# Patient Record
Sex: Female | Born: 1960 | Race: White | Hispanic: No | Smoking: Never smoker
Health system: Southern US, Community
[De-identification: ages and names within clinical notes are randomized; demographics above are authoritative.]

## PROBLEM LIST (undated history)

## (undated) DIAGNOSIS — K635 Polyp of colon: Secondary | ICD-10-CM

## (undated) DIAGNOSIS — J329 Chronic sinusitis, unspecified: Secondary | ICD-10-CM

## (undated) DIAGNOSIS — R519 Headache, unspecified: Secondary | ICD-10-CM

## (undated) DIAGNOSIS — E039 Hypothyroidism, unspecified: Secondary | ICD-10-CM

## (undated) DIAGNOSIS — G8929 Other chronic pain: Secondary | ICD-10-CM

## (undated) DIAGNOSIS — K219 Gastro-esophageal reflux disease without esophagitis: Secondary | ICD-10-CM

## (undated) DIAGNOSIS — G4730 Sleep apnea, unspecified: Secondary | ICD-10-CM

## (undated) DIAGNOSIS — R51 Headache: Secondary | ICD-10-CM

## (undated) HISTORY — DX: Chronic sinusitis, unspecified: J32.9

## (undated) HISTORY — DX: Headache: R51

## (undated) HISTORY — DX: Polyp of colon: K63.5

## (undated) HISTORY — DX: Headache, unspecified: R51.9

## (undated) HISTORY — PX: ABDOMINAL HYSTERECTOMY: SHX81

## (undated) HISTORY — DX: Gastro-esophageal reflux disease without esophagitis: K21.9

## (undated) HISTORY — PX: MENISCUS REPAIR: SHX5179

## (undated) HISTORY — DX: Sleep apnea, unspecified: G47.30

## (undated) HISTORY — DX: Hypothyroidism, unspecified: E03.9

## (undated) HISTORY — DX: Other chronic pain: G89.29

---

## 1999-05-10 ENCOUNTER — Other Ambulatory Visit: Admission: RE | Admit: 1999-05-10 | Discharge: 1999-05-10 | Payer: Self-pay | Admitting: Obstetrics and Gynecology

## 2000-07-04 ENCOUNTER — Other Ambulatory Visit: Admission: RE | Admit: 2000-07-04 | Discharge: 2000-07-04 | Payer: Self-pay | Admitting: Obstetrics and Gynecology

## 2001-09-10 ENCOUNTER — Other Ambulatory Visit: Admission: RE | Admit: 2001-09-10 | Discharge: 2001-09-10 | Payer: Self-pay | Admitting: Obstetrics and Gynecology

## 2003-07-06 ENCOUNTER — Other Ambulatory Visit: Admission: RE | Admit: 2003-07-06 | Discharge: 2003-07-06 | Payer: Self-pay | Admitting: Obstetrics and Gynecology

## 2003-11-24 LAB — HM PAP SMEAR: HM Pap smear: NORMAL

## 2004-02-18 ENCOUNTER — Ambulatory Visit: Payer: Self-pay

## 2005-01-19 ENCOUNTER — Emergency Department: Payer: Self-pay | Admitting: Emergency Medicine

## 2005-03-07 ENCOUNTER — Ambulatory Visit: Payer: Self-pay | Admitting: General Practice

## 2005-04-10 ENCOUNTER — Other Ambulatory Visit: Admission: RE | Admit: 2005-04-10 | Discharge: 2005-04-10 | Payer: Self-pay | Admitting: Obstetrics and Gynecology

## 2005-08-05 ENCOUNTER — Emergency Department: Payer: Self-pay | Admitting: Emergency Medicine

## 2005-12-12 ENCOUNTER — Ambulatory Visit: Payer: Self-pay

## 2006-03-15 ENCOUNTER — Ambulatory Visit: Payer: Self-pay | Admitting: General Practice

## 2007-03-12 ENCOUNTER — Ambulatory Visit: Payer: Self-pay | Admitting: General Practice

## 2008-04-01 ENCOUNTER — Ambulatory Visit: Payer: Self-pay | Admitting: General Practice

## 2009-01-24 ENCOUNTER — Emergency Department: Payer: Self-pay | Admitting: Unknown Physician Specialty

## 2009-04-05 ENCOUNTER — Ambulatory Visit: Payer: Self-pay | Admitting: General Practice

## 2009-08-22 ENCOUNTER — Ambulatory Visit: Payer: Self-pay | Admitting: Family Medicine

## 2009-08-22 DIAGNOSIS — J309 Allergic rhinitis, unspecified: Secondary | ICD-10-CM | POA: Insufficient documentation

## 2009-08-22 DIAGNOSIS — J019 Acute sinusitis, unspecified: Secondary | ICD-10-CM

## 2009-08-22 DIAGNOSIS — K219 Gastro-esophageal reflux disease without esophagitis: Secondary | ICD-10-CM

## 2010-03-15 NOTE — Assessment & Plan Note (Signed)
Summary: sinus infection/jbb   Vital Signs:  Patient Profile:   50 Years Old Female CC:      head congestion, sinus pressure Height:     63.75 inches Weight:      231 pounds BMI:     40.11 O2 Sat:      96 % O2 treatment:    Room Air Temp:     97.2 degrees F oral Pulse rate:   68 / minute Resp:     16 per minute BP sitting:   102 / 80  (left arm)  Pt. in pain?   no  Vitals Entered By: Adella Hare LPN (August 22, 2009 11:25 AM)                   Current Allergies (reviewed today): ! DEMEROLHistory of Present Illness History from: patient Reason for visit: see chief complaint Chief Complaint: head congestion, sinus pressure History of Present Illness: complains of head congestion, sinus pressure, headache, low grade fever at times, body aches, post nasal drainage x approx 3 days been using otc sudafed and ibuprofen.  She will get about 3 sinus infections a year-deals with hayfever-has Flonase but did not think about using this. Amoxil worked well in Jan, before that Z-pak did not work well.  She feels like she is really getting worse, and with kids at work, and upcoming vacation, she felt she should not wait any longer.  REVIEW OF SYSTEMS Constitutional Symptoms       Complains of fever and chills.     Denies night sweats, weight loss, weight gain, and fatigue.  Eyes       Denies change in vision, eye pain, eye discharge, glasses, contact lenses, and eye surgery. Ear/Nose/Throat/Mouth       Complains of change in hearing and sore throat.      Denies hearing loss/aids, ear pain, ear discharge, dizziness, frequent runny nose, frequent nose bleeds, sinus problems, hoarseness, and tooth pain or bleeding.  Respiratory       Denies dry cough, productive cough, wheezing, shortness of breath, asthma, bronchitis, and emphysema/COPD.  Cardiovascular       Denies murmurs, chest pain, and tires easily with exhertion.    Gastrointestinal       Denies stomach pain, nausea/vomiting,  diarrhea, constipation, blood in bowel movements, and indigestion. Genitourniary       Denies painful urination, kidney stones, and loss of urinary control. Neurological       Denies paralysis, seizures, and fainting/blackouts. Musculoskeletal       Denies muscle pain, joint pain, joint stiffness, decreased range of motion, redness, swelling, muscle weakness, and gout.  Skin       Denies bruising, unusual mles/lumps or sores, and hair/skin or nail changes.  Psych       Denies mood changes, temper/anger issues, anxiety/stress, speech problems, depression, and sleep problems.  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 Demerol  Physical Exam General appearance: well developed, well nourished, no acute distress, looks uncomfortable Head: normocephalic, atraumatic Eyes: conjunctivae and lids normal Pupils: equal,  round, reactive to light Ears: normal, no lesions or deformities. Left TM appears normal-no fluid seen Nasal: mucosa pink, nonedematous, no septal deviation, turbinates irritated, little exudate on left Oral/Pharynx: tongue normal, posterior pharynx without erythema or exudate. Right tonsil larger than left-still normal Neck: neck supple,  trachea midline, no masses Chest/Lungs: no rales, wheezes, or rhonchi bilateral, breath sounds equal without effort Heart: regular rate and  rhythm, no murmur Extremities: normal extremities Neurological: grossly intact and non-focal Skin: no  obvious rashes or lesions MSE: oriented to time, place, and person Assessment New Problems: SINUSITIS, ACUTE (ICD-461.9) ALLERGIC RHINITIS (ICD-477.9) GERD (ICD-530.81)   Plan New Medications/Changes: AMOXICILLIN 500 MG CAPS (AMOXICILLIN) one cap three times a day  #30 x 0, 08/22/2009, Adella Hare LPN  New Orders: New Patient Level I [99201] Planning Comments:   Resume Flonase, may use Afrin 1-2 more days, and may continue other OTC as have been doing.   The patient and/or caregiver has been counseled thoroughly with regard to medications prescribed including dosage, schedule, interactions, rationale for use, and possible side effects and they verbalize understanding.  Diagnoses and expected course of recovery discussed and will return if not improved as expected or if the condition worsens. Patient and/or caregiver verbalized understanding.  Prescriptions: AMOXICILLIN 500 MG CAPS (AMOXICILLIN) one cap three times a day  #30 x 0   Entered by:   Adella Hare LPN   Authorized by:   Kathrynn Running MD   Signed by:   Adella Hare LPN on 16/11/9602   Method used:   Electronically to        [facility]  [address] (retail)       [address], [address]       [place], [place]  [address]       Ph: [phone]       Fax: [phone]   RxID:   [id number]   Orders Added: 1)  New Patient Level I [99201]  The risks, benefits and possible side effects were clearly explained and discussed with the patient.  The patient verbalized clear understanding.  The patient was given instructions to return if symptoms don't improve, worsen or new changes develop.  If it is not during clinic hours and the patient cannot get back to this clinic then the patient was told to seek medical care at an available urgent care or emergency department.  The patient verbalized understanding.    The patient was informed that there is no on-call provider or services available at this  clinic during off-hours (when the clinic is closed).  If the patient developed a problem or concern that required immediate attention, the patient was advised to go the the nearest available urgent care or emergency department for medical care.  The patient verbalized understanding.     It was clearly explained to the patient that this [facility] is not intended to be a primary care clinic.  The patient is always better served by the continuity of care and the provider/patient relationships developed with their dedicated primary care provider.  The patient was told to be sure to follow up as soon as possible with their primary care provider to discuss treatments received and to receive further examination and testing.  The patient verbalized understanding. The will f/u with PCP ASAP.  I have reviewed the above medical office visit documention, including diagnoses, history, medications, clinical lists, orders and plan of care.   Rodney Langton,  MD, FAAFP  August 22, 2009 Added new allergy or adverse reaction of DEMEROL

## 2010-04-12 ENCOUNTER — Ambulatory Visit: Payer: Self-pay | Admitting: General Practice

## 2010-07-26 ENCOUNTER — Ambulatory Visit: Payer: Self-pay | Admitting: Internal Medicine

## 2010-10-06 ENCOUNTER — Other Ambulatory Visit (INDEPENDENT_AMBULATORY_CARE_PROVIDER_SITE_OTHER): Payer: BC Managed Care – PPO | Admitting: Internal Medicine

## 2010-10-06 DIAGNOSIS — N390 Urinary tract infection, site not specified: Secondary | ICD-10-CM

## 2010-10-06 LAB — POCT URINALYSIS DIPSTICK
Ketones, UA: NEGATIVE
Protein, UA: NEGATIVE
Spec Grav, UA: 1.025
pH, UA: 6

## 2010-10-06 MED ORDER — SULFAMETHOXAZOLE-TRIMETHOPRIM 800-160 MG PO TABS: 1.0000 | ORAL_TABLET | Freq: Two times a day (BID) | ORAL | Status: AC

## 2010-10-09 LAB — URINE CULTURE: Colony Count: 7000

## 2010-10-10 ENCOUNTER — Encounter: Payer: Self-pay | Admitting: Internal Medicine

## 2010-10-24 ENCOUNTER — Other Ambulatory Visit (INDEPENDENT_AMBULATORY_CARE_PROVIDER_SITE_OTHER): Payer: BC Managed Care – PPO | Admitting: *Deleted

## 2010-10-24 DIAGNOSIS — E039 Hypothyroidism, unspecified: Secondary | ICD-10-CM

## 2010-11-07 ENCOUNTER — Telehealth: Payer: Self-pay | Admitting: Internal Medicine

## 2010-11-07 NOTE — Telephone Encounter (Signed)
Patient called and stated she didn't finish her antibiotic she had two days to go and she started feeling better and now she is having the UTI symptoms again and was wondering if you could call in another Rx or should she come and give a specimen.

## 2010-11-07 NOTE — Telephone Encounter (Signed)
She should give a specimen and we should call in Bactrim DS tab po bid x 7 days.  She needs to give the specimen because may now have resistant infection.

## 2010-11-09 ENCOUNTER — Other Ambulatory Visit: Payer: Self-pay | Admitting: Internal Medicine

## 2010-11-09 MED ORDER — SULFAMETHOXAZOLE-TRIMETHOPRIM 800-160 MG PO TABS: 1.0000 | ORAL_TABLET | Freq: Two times a day (BID) | ORAL | Status: AC

## 2010-11-11 NOTE — Telephone Encounter (Signed)
Patient notified per Pam.

## 2010-11-13 ENCOUNTER — Emergency Department: Payer: Self-pay | Admitting: Emergency Medicine

## 2010-12-05 ENCOUNTER — Encounter: Payer: Self-pay | Admitting: Internal Medicine

## 2011-01-06 ENCOUNTER — Ambulatory Visit: Payer: BC Managed Care – PPO

## 2011-01-20 ENCOUNTER — Ambulatory Visit (INDEPENDENT_AMBULATORY_CARE_PROVIDER_SITE_OTHER): Payer: BC Managed Care – PPO | Admitting: Internal Medicine

## 2011-01-20 DIAGNOSIS — Z23 Encounter for immunization: Secondary | ICD-10-CM

## 2011-02-01 ENCOUNTER — Telehealth: Payer: Self-pay | Admitting: *Deleted

## 2011-02-01 MED ORDER — OSELTAMIVIR PHOSPHATE 75 MG PO CAPS: 75.0000 mg | ORAL_CAPSULE | Freq: Two times a day (BID) | ORAL | Status: AC

## 2011-02-01 NOTE — Telephone Encounter (Signed)
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 Yesterday pt developed fever, chills, body aches, sinus congestion & cough. Ok to [employer] tamiflu per MD, Patient informed

## 2011-02-03 ENCOUNTER — Other Ambulatory Visit: Payer: Self-pay | Admitting: *Deleted

## 2011-02-03 MED ORDER — LEVOTHYROXINE SODIUM 50 MCG PO TABS: 50.0000 ug | ORAL_TABLET | Freq: Every day | ORAL | Status: DC

## 2011-03-17 LAB — HM MAMMOGRAPHY: HM Mammogram: NORMAL

## 2011-03-20 ENCOUNTER — Telehealth: Payer: Self-pay | Admitting: *Deleted

## 2011-03-20 MED ORDER — LEVOTHYROXINE SODIUM 100 MCG PO TABS: 100.0000 ug | ORAL_TABLET | Freq: Every day | ORAL | Status: DC

## 2011-03-20 NOTE — Telephone Encounter (Signed)
Patient informed, Scheduled for labs

## 2011-03-20 NOTE — Telephone Encounter (Signed)
Pt states that she has been taking 100 mcg of synthroid qd for 6 + mths. She would like RF and wants to know if she needs labs?

## 2011-03-20 NOTE — Telephone Encounter (Signed)
Fine for refill. We should get CMP, TSH with labs.

## 2011-03-23 ENCOUNTER — Other Ambulatory Visit (INDEPENDENT_AMBULATORY_CARE_PROVIDER_SITE_OTHER): Payer: BC Managed Care – PPO | Admitting: *Deleted

## 2011-03-23 DIAGNOSIS — Z79899 Other long term (current) drug therapy: Secondary | ICD-10-CM

## 2011-03-23 DIAGNOSIS — E039 Hypothyroidism, unspecified: Secondary | ICD-10-CM

## 2011-03-24 ENCOUNTER — Other Ambulatory Visit: Payer: BC Managed Care – PPO

## 2011-03-29 ENCOUNTER — Other Ambulatory Visit: Payer: BC Managed Care – PPO

## 2011-04-03 ENCOUNTER — Ambulatory Visit: Payer: BC Managed Care – PPO | Admitting: Internal Medicine

## 2011-04-07 ENCOUNTER — Telehealth: Payer: Self-pay | Admitting: Internal Medicine

## 2011-04-07 NOTE — Telephone Encounter (Signed)
[facility] Triage Call Report Triage Record Num: [id number] Operator: Revonda Humphrey Patient Name: Kara Spears Call Date & Time: 04/07/2011 12:04:21PM Patient Phone: [phone] PCP: Ronna Polio Patient Gender: Female PCP Fax : [phone] Patient DOB: 03-Apr-1960 Practice Name: [facility] Reason for Call: Caller: Twila/Patient; PCP: Ronna Polio; CB#: [phone]; ; ; Call regarding Ques About Scabies; Parents of school children have scabies and caller has not had any contact with them or their children. Own children do not have rash or itching. Gave information about scabies from [facility]. Noting 2 small bumps between fingers so started using cream to treat fungus, bumps have receeded. Worried about scabies since others have it, but again no exposure. Does think it may have looked like poison ivy at the beginning and does have dog that is outside carrying pieces of wood to her that she has handled etc. Will observe and if Sx continue will make appt for check. Protocol(s) Used: [employer], [place], or Scientist, research (medical)) Used: Rash Recommended Outcome per Protocol: Provide Home/Self Care Reason for Outcome: Known or suspected exposure to [employer], [place] OR [place] All other situations Care Advice: ~ Resting will help avoid overheating and sweating which will intensify the irritation. Apply cool compresses to affected area(s) for 20 minutes 4 to 6 times daily to help relieve itching and provide a topical anesthesia. ~ Calamine is appropriate if used as directed on the label or by a pharmacist. DO NOT use topical preparations with benzocaine because they can be sensitizing agents and can cause an allergic reaction. ~ If you were with your pet, give him a bath with pet shampoo while wearing rubber gloves. Your pet will not likely to be sensitive to the oil but the oil will stick to their fur and cause a reaction in someone who pets them. ~ Itching  Relief: - Avoid scratching or rubbing irritated area; may cause further irritation and secondary infection - Take cool showers or baths several times a day to relieve itching; do not use soap - If cool water alone does not relieve itching, try adding 1/2 to 1 cup baking soda to bath water - Follow with application of a bland lotion such as calamine (do not apply to the eyes or genitals). ~ 04/07/2011 12:28:16PM Page 1 of 1 [id number]

## 2011-04-19 ENCOUNTER — Ambulatory Visit: Payer: Self-pay | Admitting: General Practice

## 2011-05-10 ENCOUNTER — Other Ambulatory Visit (INDEPENDENT_AMBULATORY_CARE_PROVIDER_SITE_OTHER): Payer: BC Managed Care – PPO | Admitting: *Deleted

## 2011-05-10 ENCOUNTER — Telehealth: Payer: Self-pay | Admitting: *Deleted

## 2011-05-10 DIAGNOSIS — Z0000 Encounter for general adult medical examination without abnormal findings: Secondary | ICD-10-CM

## 2011-05-10 DIAGNOSIS — R5381 Other malaise: Secondary | ICD-10-CM

## 2011-05-10 DIAGNOSIS — E039 Hypothyroidism, unspecified: Secondary | ICD-10-CM

## 2011-05-10 DIAGNOSIS — R5383 Other fatigue: Secondary | ICD-10-CM

## 2011-05-10 LAB — CBC WITH DIFFERENTIAL/PLATELET
Basophils Absolute: 0 10*3/uL (ref 0.0–0.1)
Eosinophils Relative: 3.4 % (ref 0.0–5.0)
Lymphocytes Relative: 27.4 % (ref 12.0–46.0)
Monocytes Relative: 7.9 % (ref 3.0–12.0)
Neutrophils Relative %: 60.8 % (ref 43.0–77.0)
Platelets: 292 10*3/uL (ref 150.0–400.0)
RDW: 15.3 % — ABNORMAL HIGH (ref 11.5–14.6)
WBC: 7.6 10*3/uL (ref 4.5–10.5)

## 2011-05-10 LAB — COMPREHENSIVE METABOLIC PANEL
ALT: 36 U/L — ABNORMAL HIGH (ref 0–35)
Albumin: 3.8 g/dL (ref 3.5–5.2)
CO2: 25 mEq/L (ref 19–32)
Chloride: 105 mEq/L (ref 96–112)
GFR: 101.1 mL/min (ref >60.00)
Potassium: 3.6 mEq/L (ref 3.5–5.1)
Sodium: 140 mEq/L (ref 135–145)
Total Bilirubin: 0.3 mg/dL (ref 0.3–1.2)
Total Protein: 7.2 g/dL (ref 6.0–8.3)

## 2011-05-10 LAB — LIPID PANEL: HDL: 33.9 mg/dL — ABNORMAL LOW (ref >39.00)

## 2011-05-10 NOTE — Telephone Encounter (Signed)
Patient is coming in today for labs. She is fasting. What labs would you like for me to order?

## 2011-05-10 NOTE — Telephone Encounter (Signed)
Lets order CBC, CMP, TSH, lipids, Vit D.

## 2011-05-10 NOTE — Telephone Encounter (Signed)
Thanks     Labs ordered

## 2011-05-11 ENCOUNTER — Other Ambulatory Visit: Payer: Self-pay | Admitting: *Deleted

## 2011-05-11 LAB — VITAMIN D 25 HYDROXY (VIT D DEFICIENCY, FRACTURES): Vit D, 25-Hydroxy: 10 ng/mL — ABNORMAL LOW (ref 30–89)

## 2011-05-11 MED ORDER — ERGOCALCIFEROL 1.25 MG (50000 UT) PO CAPS: 50000.0000 [IU] | ORAL_CAPSULE | ORAL | Status: DC

## 2011-05-15 ENCOUNTER — Encounter: Payer: Self-pay | Admitting: Internal Medicine

## 2011-05-15 ENCOUNTER — Ambulatory Visit (INDEPENDENT_AMBULATORY_CARE_PROVIDER_SITE_OTHER): Payer: BC Managed Care – PPO | Admitting: Internal Medicine

## 2011-05-15 VITALS — BP 122/80 | HR 91 | Temp 97.0°F | Ht 65.0 in | Wt 252.0 lb

## 2011-05-15 DIAGNOSIS — E039 Hypothyroidism, unspecified: Secondary | ICD-10-CM

## 2011-05-15 DIAGNOSIS — E669 Obesity, unspecified: Secondary | ICD-10-CM

## 2011-05-15 DIAGNOSIS — K219 Gastro-esophageal reflux disease without esophagitis: Secondary | ICD-10-CM | POA: Insufficient documentation

## 2011-05-15 DIAGNOSIS — F419 Anxiety disorder, unspecified: Secondary | ICD-10-CM | POA: Insufficient documentation

## 2011-05-15 DIAGNOSIS — F411 Generalized anxiety disorder: Secondary | ICD-10-CM

## 2011-05-15 DIAGNOSIS — G4730 Sleep apnea, unspecified: Secondary | ICD-10-CM

## 2011-05-15 DIAGNOSIS — Z8249 Family history of ischemic heart disease and other diseases of the circulatory system: Secondary | ICD-10-CM

## 2011-05-15 MED ORDER — DEXLANSOPRAZOLE 60 MG PO CPDR: 60.0000 mg | DELAYED_RELEASE_CAPSULE | Freq: Every day | ORAL | Status: DC

## 2011-05-15 MED ORDER — CITALOPRAM HYDROBROMIDE 10 MG PO TABS: 10.0000 mg | ORAL_TABLET | Freq: Every day | ORAL | Status: DC

## 2011-05-15 MED ORDER — PHENTERMINE HCL 37.5 MG PO CAPS: 37.5000 mg | ORAL_CAPSULE | ORAL | Status: DC

## 2011-05-15 NOTE — Assessment & Plan Note (Signed)
BMI 41. Discussed limiting caloric intake and keeping a food diary using myfitnesspal. Will start phentermine to help with appetite suppression. Goal calories <1500 daily. Encouraged increased physical activity.  Goal weight loss 1-2 lbs per week. Follow up 1 month.

## 2011-05-15 NOTE — Assessment & Plan Note (Signed)
TSH normal with recent labs. Will continue Synthroid. Follow up 1 month.

## 2011-05-15 NOTE — Assessment & Plan Note (Signed)
Family h/o aortic aneurysm in father age 49.  Will order screening abdominal US for evaluation.

## 2011-05-15 NOTE — Assessment & Plan Note (Signed)
Symptoms worsening. Will add Dexilant. If no improvement, will send testing for H. Pylori. Follow up 1 month.

## 2011-05-15 NOTE — Assessment & Plan Note (Signed)
Recent worsening in symptoms. Will start Celexa 10 mg daily. Patient will return to clinic in one month.

## 2011-05-15 NOTE — Assessment & Plan Note (Signed)
Patient is not currently wearing CPAP mask because of issues with date. She has not had a sleep study in over 10 years. Will reschedule sleep study.

## 2011-05-15 NOTE — Progress Notes (Signed)
Subjective:    Patient ID: Kara Spears, female    DOB: 03/02/1960, 51 y.o.   MRN: [id number]  HPI 51 year old female with hypothyroidism presents for followup. Her primary concern today is recent increase in anxiety and depressed mood. She notes significant increase in stress with caring for her daughter who just had a baby and in caring for her daughter who is about to get married. She also has significant work Counselling psychologist. She reports feeling sad and overwhelmed at times. She notes that she frequently cries. She has never taken medication for depression in the past.  She is also concerned about her weight. She has been trying to lose weight but has found it difficult to exercise of her busy schedule. She has used medication to help with appetite in the past with some success.  She also notes recent increase in upper epigastric abdominal pain/GERD. She has been taking omeprazole with some improvement but not complete resolution of her symptoms.  She also reports significant fatigue.  She notes she has not been wearing her CPAP because of issues with the mask. It has been over 10 years since last sleep study.  Outpatient Encounter Prescriptions as of 05/15/2011  Medication Sig Dispense Refill  . ergocalciferol (DRISDOL) 50000 UNITS capsule Take 1 capsule (50,000 Units total) by mouth once a week.  12 capsule  2  . levothyroxine (SYNTHROID, LEVOTHROID) 100 MCG tablet Take 1 tablet (100 mcg total) by mouth daily.  90 tablet  1  . omeprazole (PRILOSEC OTC) 20 MG tablet Take 20 mg by mouth daily.        . citalopram (CELEXA) 10 MG tablet Take 1 tablet (10 mg total) by mouth daily.  30 tablet  3  . dexlansoprazole (DEXILANT) 60 MG capsule Take 1 capsule (60 mg total) by mouth daily.  30 capsule  3  . phentermine 37.5 MG capsule Take 1 capsule (37.5 mg total) by mouth every morning.  30 capsule  1    Review of Systems  Constitutional: Positive for fatigue. Negative for fever, chills,  appetite change and unexpected weight change.  HENT: Negative for ear pain, congestion, sore throat, trouble swallowing, neck pain, voice change and sinus pressure.   Eyes: Negative for visual disturbance.  Respiratory: Negative for cough, shortness of breath, wheezing and stridor.   Cardiovascular: Negative for chest pain, palpitations and leg swelling.  Gastrointestinal: Negative for nausea, vomiting, abdominal pain, diarrhea, constipation, blood in stool, abdominal distention and anal bleeding.  Genitourinary: Negative for dysuria and flank pain.  Musculoskeletal: Negative for myalgias, arthralgias and gait problem.  Skin: Negative for color change and rash.  Neurological: Negative for dizziness and headaches.  Hematological: Negative for adenopathy. Does not bruise/bleed easily.  Psychiatric/Behavioral: Positive for dysphoric mood. Negative for suicidal ideas and sleep disturbance. The patient is nervous/anxious.    BP 122/80  Pulse 91  Temp(Src) 97 F (36.1 C) (Oral)  Ht 5\' 5"  (1.651 m)  Wt 252 lb (114.306 kg)  BMI 41.93 kg/m2  SpO2 97%     Objective:   Physical Exam  Constitutional: She is oriented to person, place, and time. She appears well-developed and well-nourished. No distress.  HENT:  Head: Normocephalic and atraumatic.  Right Ear: External ear normal.  Left Ear: External ear normal.  Nose: Nose normal.  Mouth/Throat: Oropharynx is clear and moist. No oropharyngeal exudate.  Eyes: Conjunctivae are normal. Pupils are equal, round, and reactive to light. Right eye exhibits no discharge. Left eye exhibits no discharge. No scleral  icterus.  Neck: Normal range of motion. Neck supple. No tracheal deviation present. No thyromegaly present.  Cardiovascular: Normal rate, regular rhythm, normal heart sounds and intact distal pulses.  Exam reveals no gallop and no friction rub.   No murmur heard. Pulmonary/Chest: Effort normal and breath sounds normal. No respiratory distress.  She has no wheezes. She has no rales. She exhibits no tenderness.  Abdominal: Soft. Bowel sounds are normal. She exhibits no distension. There is tenderness (mid-epigastric area).  Musculoskeletal: Normal range of motion. She exhibits no edema and no tenderness.  Lymphadenopathy:    She has no cervical adenopathy.  Neurological: She is alert and oriented to person, place, and time. No cranial nerve deficit. She exhibits normal muscle tone. Coordination normal.  Skin: Skin is warm and dry. No rash noted. She is not diaphoretic. No erythema. No pallor.  Psychiatric: She has a normal mood and affect. Her behavior is normal. Judgment and thought content normal.          Assessment & Plan:

## 2011-05-18 ENCOUNTER — Encounter: Payer: Self-pay | Admitting: Internal Medicine

## 2011-05-19 ENCOUNTER — Telehealth: Payer: Self-pay | Admitting: *Deleted

## 2011-05-19 NOTE — Telephone Encounter (Signed)
Daughter reports pt has had excellent results with Dexilant.  PA is required. PA # [phone] ID# [id number]

## 2011-05-25 NOTE — Telephone Encounter (Signed)
Printed form for PA online, filled out and waiting for sig from MD. Patient has enough samples to get through until Dr. Dan Humphreys is back in the office.

## 2011-05-30 NOTE — Telephone Encounter (Signed)
Form faxed, message sent to patient to inform

## 2011-06-06 NOTE — Telephone Encounter (Signed)
I spoke to Noel N at [place] and PA was approved on 05/31/11 and will be valid through 02/23/14 or life of policy.  Pharmacy notified.

## 2011-06-15 ENCOUNTER — Encounter: Payer: Self-pay | Admitting: Internal Medicine

## 2011-06-15 ENCOUNTER — Ambulatory Visit (INDEPENDENT_AMBULATORY_CARE_PROVIDER_SITE_OTHER): Payer: BC Managed Care – PPO | Admitting: Internal Medicine

## 2011-06-15 VITALS — BP 108/78 | HR 92 | Temp 98.5°F | Resp 16 | Wt 231.2 lb

## 2011-06-15 DIAGNOSIS — L259 Unspecified contact dermatitis, unspecified cause: Secondary | ICD-10-CM

## 2011-06-15 DIAGNOSIS — G4730 Sleep apnea, unspecified: Secondary | ICD-10-CM

## 2011-06-15 DIAGNOSIS — K219 Gastro-esophageal reflux disease without esophagitis: Secondary | ICD-10-CM

## 2011-06-15 DIAGNOSIS — F411 Generalized anxiety disorder: Secondary | ICD-10-CM

## 2011-06-15 DIAGNOSIS — F419 Anxiety disorder, unspecified: Secondary | ICD-10-CM

## 2011-06-15 DIAGNOSIS — L309 Dermatitis, unspecified: Secondary | ICD-10-CM | POA: Insufficient documentation

## 2011-06-15 DIAGNOSIS — R5383 Other fatigue: Secondary | ICD-10-CM

## 2011-06-15 DIAGNOSIS — E669 Obesity, unspecified: Secondary | ICD-10-CM

## 2011-06-15 MED ORDER — PHENTERMINE HCL 37.5 MG PO CAPS: 37.5000 mg | ORAL_CAPSULE | ORAL | Status: DC

## 2011-06-15 MED ORDER — TRIAMCINOLONE ACETONIDE 0.1 % EX CREA: TOPICAL_CREAM | Freq: Two times a day (BID) | CUTANEOUS | Status: DC

## 2011-06-15 NOTE — Assessment & Plan Note (Signed)
Symptoms improved with use of Dexilant. Will continue.

## 2011-06-15 NOTE — Assessment & Plan Note (Signed)
BMI 38. Congratulated patient on 20 pound weight loss. Encouraged her to continue efforts at calorie counting and increased physical activity. Will continue phentermine. Patient will followup in one month.

## 2011-06-15 NOTE — Assessment & Plan Note (Signed)
Rash on hands consistent with contact dermatitis. Will start topical triamcinolone twice daily to see if any improvement. Followup if symptoms are not improving or if they worsen.

## 2011-06-15 NOTE — Progress Notes (Signed)
Subjective:    Patient ID: Kara Spears, female    DOB: 06/04/1960, 51 y.o.   MRN: [id number]  HPI 51 year old female with history of anxiety, obesity presents for followup. In regards to her anxiety, we had initially tried treating her with Celexa. She was unable to tolerate this medication and had some altered mentation. She stopped the medication with resolution of her symptoms. She reports that her overall anxiety level is much improved recently and she does not feel that she needs to start on any new medication.  Regards to her obesity, she reports significant improvement in her appetite with use of phentermine. She has also been using MyFItnessPal to help with calorie counting. She has lost over 20 pounds since her last visit one month ago. She denies any side effects from medication such as palpitations, or agitation. She is planning to start an exercise program including water aerobics. Next  She is concerned today about red, itchy rash over her left thumb and hand. She reports that she was wearing a brace on this wrist for carpal tunnel syndrome and soon after developed an itchy rash in this area. She recently has been applying a topical steroid cream with some improvement.  In regards to her history of GERD, she reports significant improvement with the use of Dexilant.  Outpatient Encounter Prescriptions as of 06/15/2011  Medication Sig Dispense Refill  . dexlansoprazole (DEXILANT) 60 MG capsule Take 60 mg by mouth daily.      . diphenhydrAMINE (BENADRYL ALLERGY) 25 mg capsule Take 25 mg by mouth at bedtime.      . ergocalciferol (DRISDOL) 50000 UNITS capsule Take 1 capsule (50,000 Units total) by mouth once a week.  12 capsule  2  . levothyroxine (SYNTHROID, LEVOTHROID) 100 MCG tablet Take 1 tablet (100 mcg total) by mouth daily.  90 tablet  1  . phentermine 37.5 MG capsule Take 1 capsule (37.5 mg total) by mouth every morning.  30 capsule  2  . citalopram (CELEXA) 10 MG tablet Take  1 tablet (10 mg total) by mouth daily.  30 tablet  3  . triamcinolone cream (KENALOG) 0.1 % Apply topically 2 (two) times daily.  30 g  0  . DISCONTD: omeprazole (PRILOSEC OTC) 20 MG tablet Take 20 mg by mouth daily.          Review of Systems  Constitutional: Negative for fever, chills, appetite change, fatigue and unexpected weight change.  HENT: Negative for ear pain, congestion, sore throat, trouble swallowing, neck pain, voice change and sinus pressure.   Eyes: Negative for visual disturbance.  Respiratory: Negative for cough, shortness of breath, wheezing and stridor.   Cardiovascular: Negative for chest pain, palpitations and leg swelling.  Gastrointestinal: Negative for nausea, vomiting, abdominal pain, diarrhea, constipation, blood in stool, abdominal distention and anal bleeding.  Genitourinary: Negative for dysuria and flank pain.  Musculoskeletal: Negative for myalgias, arthralgias and gait problem.  Skin: Negative for color change and rash.  Neurological: Negative for dizziness and headaches.  Hematological: Negative for adenopathy. Does not bruise/bleed easily.  Psychiatric/Behavioral: Negative for suicidal ideas, sleep disturbance and dysphoric mood. The patient is not nervous/anxious.    BP 108/78  Pulse 92  Temp(Src) 98.5 F (36.9 C) (Oral)  Resp 16  Wt 231 lb 4 oz (104.894 kg)  SpO2 96%     Objective:   Physical Exam  Constitutional: She is oriented to person, place, and time. She appears well-developed and well-nourished. No distress.  HENT:  Head: Normocephalic  and atraumatic.  Right Ear: External ear normal.  Left Ear: External ear normal.  Nose: Nose normal.  Mouth/Throat: Oropharynx is clear and moist. No oropharyngeal exudate.  Eyes: Conjunctivae are normal. Pupils are equal, round, and reactive to light. Right eye exhibits no discharge. Left eye exhibits no discharge. No scleral icterus.  Neck: Normal range of motion. Neck supple. No tracheal deviation  present. No thyromegaly present.  Cardiovascular: Normal rate, regular rhythm, normal heart sounds and intact distal pulses.  Exam reveals no gallop and no friction rub.   No murmur heard. Pulmonary/Chest: Effort normal and breath sounds normal. No respiratory distress. She has no wheezes. She has no rales. She exhibits no tenderness.  Musculoskeletal: Normal range of motion. She exhibits no edema and no tenderness.  Lymphadenopathy:    She has no cervical adenopathy.  Neurological: She is alert and oriented to person, place, and time. No cranial nerve deficit. She exhibits normal muscle tone. Coordination normal.  Skin: Skin is warm and dry. No rash noted. She is not diaphoretic. No erythema. No pallor.  Psychiatric: She has a normal mood and affect. Her behavior is normal. Judgment and thought content normal.          Assessment & Plan:

## 2011-06-15 NOTE — Assessment & Plan Note (Signed)
Symptoms currently manageable without medication. Will continue to monitor.

## 2011-06-15 NOTE — Assessment & Plan Note (Signed)
Sleep study was ordered again today.

## 2011-06-30 ENCOUNTER — Telehealth: Payer: Self-pay | Admitting: Internal Medicine

## 2011-06-30 MED ORDER — ERGOCALCIFEROL 1.25 MG (50000 UT) PO CAPS: 50000.0000 [IU] | ORAL_CAPSULE | ORAL | Status: DC

## 2011-06-30 NOTE — Telephone Encounter (Signed)
Patient called and stated her dog ate all of the vitamin d tablets she had left and wanted to know if a refill could be called in for the remaining tablets.  Please advise.

## 2011-06-30 NOTE — Telephone Encounter (Signed)
Her dog will likely have GI upset.  She may want to talk to her vet. Yes, we can call a refill.

## 2011-06-30 NOTE — Telephone Encounter (Signed)
Patient notified, Rx called in. 

## 2011-07-14 ENCOUNTER — Other Ambulatory Visit: Payer: Self-pay | Admitting: Internal Medicine

## 2011-07-14 MED ORDER — ERGOCALCIFEROL 1.25 MG (50000 UT) PO CAPS: 50000.0000 [IU] | ORAL_CAPSULE | ORAL | Status: DC

## 2011-07-14 MED ORDER — CIPROFLOXACIN HCL 500 MG PO TABS: 500.0000 mg | ORAL_TABLET | Freq: Two times a day (BID) | ORAL | Status: DC

## 2011-07-20 ENCOUNTER — Ambulatory Visit (INDEPENDENT_AMBULATORY_CARE_PROVIDER_SITE_OTHER): Payer: BC Managed Care – PPO | Admitting: Internal Medicine

## 2011-07-20 ENCOUNTER — Encounter: Payer: Self-pay | Admitting: Internal Medicine

## 2011-07-20 VITALS — BP 114/78 | HR 86 | Temp 98.2°F | Resp 16 | Wt 217.5 lb

## 2011-07-20 DIAGNOSIS — E039 Hypothyroidism, unspecified: Secondary | ICD-10-CM

## 2011-07-20 DIAGNOSIS — N390 Urinary tract infection, site not specified: Secondary | ICD-10-CM

## 2011-07-20 DIAGNOSIS — R300 Dysuria: Secondary | ICD-10-CM

## 2011-07-20 DIAGNOSIS — E669 Obesity, unspecified: Secondary | ICD-10-CM

## 2011-07-20 DIAGNOSIS — E785 Hyperlipidemia, unspecified: Secondary | ICD-10-CM

## 2011-07-20 LAB — POCT URINALYSIS DIPSTICK
Bilirubin, UA: NEGATIVE
Glucose, UA: NEGATIVE
Ketones, UA: NEGATIVE
Nitrite, UA: NEGATIVE

## 2011-07-20 MED ORDER — PHENTERMINE HCL 37.5 MG PO CAPS: 37.5000 mg | ORAL_CAPSULE | ORAL | Status: DC

## 2011-07-20 MED ORDER — SULFAMETHOXAZOLE-TMP DS 800-160 MG PO TABS: 1.0000 | ORAL_TABLET | Freq: Two times a day (BID) | ORAL | Status: AC

## 2011-07-20 NOTE — Assessment & Plan Note (Signed)
Congratulated patient on continued weight loss. Encouraged her to continue with food diary and calorie restriction. Will continue phentermine for appetite suppression. Followup one month.

## 2011-07-20 NOTE — Progress Notes (Signed)
Subjective:    Patient ID: Kara Spears, female    DOB: [birth year], 51 y.o.   MRN: [id number]  HPI 51 year old female with history of obesity, hypothyroidism, and fatigue presents for followup. She is doing well. She has lost another 15 pounds. She continues to follow a healthy, low calorie diet using a food diary. She also continues on phentermine to help with appetite suppression. She denies any side effects from this medication. She has forgot to take the medication on a couple of occasions with no change in her appetite.  She notes that she is scheduled for sleep study next week.  Outpatient Encounter Prescriptions as of 07/20/2011  Medication Sig Dispense Refill  . dexlansoprazole (DEXILANT) 60 MG capsule Take 60 mg by mouth daily.      . diphenhydrAMINE (BENADRYL ALLERGY) 25 mg capsule Take 25 mg by mouth at bedtime.      . ergocalciferol (DRISDOL) 50000 UNITS capsule Take 1 capsule (50,000 Units total) by mouth once a week.  6 capsule  2  . levothyroxine (SYNTHROID, LEVOTHROID) 100 MCG tablet Take 1 tablet (100 mcg total) by mouth daily.  90 tablet  1  . phentermine 37.5 MG capsule Take 1 capsule (37.5 mg total) by mouth every morning.  30 capsule  2  . DISCONTD: phentermine 37.5 MG capsule Take 1 capsule (37.5 mg total) by mouth every morning.  30 capsule  2  . sulfamethoxazole-trimethoprim (BACTRIM DS) 800-160 MG per tablet Take 1 tablet by mouth 2 (two) times daily.  14 tablet  0   BP 114/78  Pulse 86  Temp(Src) 98.2 F (36.8 C) (Oral)  Resp 16  Wt 217 lb 8 oz (98.657 kg)  SpO2 98%  Review of Systems  Constitutional: Positive for fatigue. Negative for fever, chills, appetite change and unexpected weight change.  HENT: Negative for ear pain, congestion, sore throat, trouble swallowing, neck pain, voice change and sinus pressure.   Eyes: Negative for visual disturbance.  Respiratory: Negative for cough, shortness of breath, wheezing and stridor.   Cardiovascular: Negative  for chest pain, palpitations and leg swelling.  Gastrointestinal: Negative for nausea, vomiting, abdominal pain, diarrhea, constipation, blood in stool, abdominal distention and anal bleeding.  Genitourinary: Negative for dysuria and flank pain.  Musculoskeletal: Negative for myalgias, arthralgias and gait problem.  Skin: Negative for color change and rash.  Neurological: Negative for dizziness and headaches.  Hematological: Negative for adenopathy. Does not bruise/bleed easily.  Psychiatric/Behavioral: Negative for suicidal ideas, sleep disturbance and dysphoric mood. The patient is not nervous/anxious.        Objective:   Physical Exam  Constitutional: She is oriented to person, place, and time. She appears well-developed and well-nourished. No distress.  HENT:  Head: Normocephalic and atraumatic.  Right Ear: External ear normal.  Left Ear: External ear normal.  Nose: Nose normal.  Mouth/Throat: Oropharynx is clear and moist. No oropharyngeal exudate.  Eyes: Conjunctivae are normal. Pupils are equal, round, and reactive to light. Right eye exhibits no discharge. Left eye exhibits no discharge. No scleral icterus.  Neck: Normal range of motion. Neck supple. No tracheal deviation present. No thyromegaly present.  Cardiovascular: Normal rate, regular rhythm, normal heart sounds and intact distal pulses.  Exam reveals no gallop and no friction rub.   No murmur heard. Pulmonary/Chest: Effort normal and breath sounds normal. No respiratory distress. She has no wheezes. She has no rales. She exhibits no tenderness.  Musculoskeletal: Normal range of motion. She exhibits no edema and no tenderness.  Lymphadenopathy:    She has no cervical adenopathy.  Neurological: She is alert and oriented to person, place, and time. No cranial nerve deficit. She exhibits normal muscle tone. Coordination normal.  Skin: Skin is warm and dry. No rash noted. She is not diaphoretic. No erythema. No pallor.    Psychiatric: She has a normal mood and affect. Her behavior is normal. Judgment and thought content normal.          Assessment & Plan:

## 2011-07-22 LAB — URINE CULTURE: Colony Count: 10000

## 2011-07-23 MED ORDER — NITROFURANTOIN MONOHYD MACRO 100 MG PO CAPS: 100.0000 mg | ORAL_CAPSULE | Freq: Two times a day (BID) | ORAL | Status: AC

## 2011-07-23 NOTE — Progress Notes (Signed)
Addended by: Ronna Polio A on: 07/23/2011 10:06 PM   Modules accepted: Orders

## 2011-08-09 ENCOUNTER — Ambulatory Visit: Payer: Self-pay | Admitting: Internal Medicine

## 2011-08-16 ENCOUNTER — Other Ambulatory Visit (INDEPENDENT_AMBULATORY_CARE_PROVIDER_SITE_OTHER): Payer: BC Managed Care – PPO | Admitting: *Deleted

## 2011-08-16 DIAGNOSIS — E785 Hyperlipidemia, unspecified: Secondary | ICD-10-CM

## 2011-08-16 DIAGNOSIS — E039 Hypothyroidism, unspecified: Secondary | ICD-10-CM

## 2011-08-16 LAB — COMPREHENSIVE METABOLIC PANEL
ALT: 21 U/L (ref 0–35)
AST: 24 U/L (ref 0–37)
Alkaline Phosphatase: 59 U/L (ref 39–117)
Creatinine, Ser: 0.8 mg/dL (ref 0.4–1.2)
GFR: 82.07 mL/min (ref >60.00)
Sodium: 139 mEq/L (ref 135–145)
Total Bilirubin: 0.8 mg/dL (ref 0.3–1.2)
Total Protein: 7.6 g/dL (ref 6.0–8.3)

## 2011-08-18 ENCOUNTER — Other Ambulatory Visit: Payer: BC Managed Care – PPO

## 2011-08-21 ENCOUNTER — Telehealth: Payer: Self-pay | Admitting: Internal Medicine

## 2011-08-21 NOTE — Telephone Encounter (Signed)
Sleep study was normal. 

## 2011-08-22 LAB — LIPID PANEL
HDL: 33.9 mg/dL — ABNORMAL LOW (ref >39.00)
Total CHOL/HDL Ratio: 7
VLDL: 39.2 mg/dL (ref 0.0–40.0)

## 2011-08-22 LAB — LDL CHOLESTEROL, DIRECT: Direct LDL: 139.7 mg/dL

## 2011-08-23 ENCOUNTER — Ambulatory Visit: Payer: BC Managed Care – PPO | Admitting: Internal Medicine

## 2011-09-15 ENCOUNTER — Other Ambulatory Visit (INDEPENDENT_AMBULATORY_CARE_PROVIDER_SITE_OTHER): Payer: BC Managed Care – PPO | Admitting: *Deleted

## 2011-09-15 DIAGNOSIS — E039 Hypothyroidism, unspecified: Secondary | ICD-10-CM

## 2011-09-15 LAB — TSH: TSH: 0.74 u[IU]/mL (ref 0.35–5.50)

## 2011-09-17 ENCOUNTER — Other Ambulatory Visit: Payer: Self-pay | Admitting: Internal Medicine

## 2011-09-21 ENCOUNTER — Encounter: Payer: Self-pay | Admitting: Internal Medicine

## 2011-09-21 ENCOUNTER — Ambulatory Visit (INDEPENDENT_AMBULATORY_CARE_PROVIDER_SITE_OTHER): Payer: BC Managed Care – PPO | Admitting: Internal Medicine

## 2011-09-21 VITALS — BP 140/86 | HR 87 | Temp 98.4°F | Ht 65.0 in | Wt 199.0 lb

## 2011-09-21 DIAGNOSIS — E669 Obesity, unspecified: Secondary | ICD-10-CM

## 2011-09-21 DIAGNOSIS — E039 Hypothyroidism, unspecified: Secondary | ICD-10-CM

## 2011-09-21 DIAGNOSIS — G4730 Sleep apnea, unspecified: Secondary | ICD-10-CM

## 2011-09-21 MED ORDER — PHENTERMINE HCL 37.5 MG PO CAPS: 37.5000 mg | ORAL_CAPSULE | ORAL | Status: DC

## 2011-09-21 NOTE — Assessment & Plan Note (Signed)
Total weight loss of 50 pounds. Weight loss over the last 2 months 20 pounds. Congratulated patient on weight loss. Encouraged her to continue efforts at monitoring caloric intake and increase physical activity. Will continue to use phentermine as needed to help with appetite suppression. She is currently only using this a couple of days per week. She will followup in one month.

## 2011-09-21 NOTE — Progress Notes (Signed)
Subjective:    Patient ID: Kara Spears, female    DOB: 01/03/1961, 51 y.o.   MRN: [id number]  HPI 51 year old female with history of hypothyroidism, obesity, and sleep apnea presents for followup. She reports she is doing well. She has lost another 20 pounds over the last 2 months. She continues to use an on line food diary to help manage calorie intake. She is also increased her physical activity. She only occasionally uses phentermine to help with appetite suppression. She denies any side effects from this medication.  In regards to hypothyroidism, review of recent labs shows a decline in her TSH from previous, but this is still in the normal range. She reports occasional hot flashes but no palpitations or other symptoms. She reports full compliance with her Synthroid.  She also recently had a sleep study which showed no sleep apnea. She reports some relief that she will no longer need to use her CPAP.  Outpatient Encounter Prescriptions as of 09/21/2011  Medication Sig Dispense Refill  . dexlansoprazole (DEXILANT) 60 MG capsule Take 60 mg by mouth daily.      . diphenhydrAMINE (BENADRYL ALLERGY) 25 mg capsule Take 25 mg by mouth at bedtime.      . ergocalciferol (DRISDOL) 50000 UNITS capsule Take 1 capsule (50,000 Units total) by mouth once a week.  6 capsule  2  . levothyroxine (SYNTHROID, LEVOTHROID) 100 MCG tablet TAKE 1 TABLET (100 MCG TOTAL) BY MOUTH DAILY.  90 tablet  3  . phentermine 37.5 MG capsule Take 1 capsule (37.5 mg total) by mouth every morning.  30 capsule  2  . DISCONTD: phentermine 37.5 MG capsule Take 1 capsule (37.5 mg total) by mouth every morning.  30 capsule  2    Review of Systems  Constitutional: Negative for fever, chills, appetite change, fatigue and unexpected weight change.  HENT: Negative for ear pain, congestion, sore throat, trouble swallowing, neck pain, voice change and sinus pressure.   Eyes: Negative for visual disturbance.  Respiratory: Negative for  cough, shortness of breath, wheezing and stridor.   Cardiovascular: Negative for chest pain, palpitations and leg swelling.  Gastrointestinal: Negative for nausea, vomiting, abdominal pain, diarrhea, constipation, blood in stool, abdominal distention and anal bleeding.  Genitourinary: Negative for dysuria and flank pain.  Musculoskeletal: Negative for myalgias, arthralgias and gait problem.  Skin: Negative for color change and rash.  Neurological: Negative for dizziness and headaches.  Hematological: Negative for adenopathy. Does not bruise/bleed easily.  Psychiatric/Behavioral: Negative for suicidal ideas, disturbed wake/sleep cycle and dysphoric mood. The patient is not nervous/anxious.    BP 140/86  Pulse 87  Temp 98.4 F (36.9 C) (Oral)  Ht 5\' 5"  (1.651 m)  Wt 199 lb (90.266 kg)  BMI 33.12 kg/m2  SpO2 98%     Objective:   Physical Exam  Constitutional: She is oriented to person, place, and time. She appears well-developed and well-nourished. No distress.  HENT:  Head: Normocephalic and atraumatic.  Right Ear: External ear normal.  Left Ear: External ear normal.  Nose: Nose normal.  Mouth/Throat: Oropharynx is clear and moist. No oropharyngeal exudate.  Eyes: Conjunctivae are normal. Pupils are equal, round, and reactive to light. Right eye exhibits no discharge. Left eye exhibits no discharge. No scleral icterus.  Neck: Normal range of motion. Neck supple. No tracheal deviation present. No thyromegaly present.  Cardiovascular: Normal rate, regular rhythm, normal heart sounds and intact distal pulses.  Exam reveals no gallop and no friction rub.   No murmur  heard. Pulmonary/Chest: Effort normal and breath sounds normal. No respiratory distress. She has no wheezes. She has no rales. She exhibits no tenderness.  Musculoskeletal: Normal range of motion. She exhibits no edema and no tenderness.  Lymphadenopathy:    She has no cervical adenopathy.  Neurological: She is alert and  oriented to person, place, and time. No cranial nerve deficit. She exhibits normal muscle tone. Coordination normal.  Skin: Skin is warm and dry. No rash noted. She is not diaphoretic. No erythema. No pallor.  Psychiatric: She has a normal mood and affect. Her behavior is normal. Judgment and thought content normal.          Assessment & Plan:

## 2011-09-21 NOTE — Assessment & Plan Note (Addendum)
TSH in normal range but has declined from previous, likely secondary to 50 pound weight loss. Will plan to repeat TSH in 2-3 months. Will likely need to decrease Synthroid dose in the future.

## 2011-09-21 NOTE — Assessment & Plan Note (Signed)
Recent sleep study shows no evidence of sleep apnea. Will discontinue CPAP.

## 2011-09-22 ENCOUNTER — Other Ambulatory Visit: Payer: Self-pay | Admitting: *Deleted

## 2011-09-22 ENCOUNTER — Telehealth: Payer: Self-pay | Admitting: Internal Medicine

## 2011-09-22 MED ORDER — LEVOTHYROXINE SODIUM 75 MCG PO TABS: 75.0000 ug | ORAL_TABLET | Freq: Every day | ORAL | Status: DC

## 2011-09-22 NOTE — Telephone Encounter (Signed)
Caller: Lakyla/Patient; PCP: Ronna Polio; CB#: [phone]; ; ; Call regarding Jittery and palpitations.  Onset- 09/20/11 Afebrile. BP- 140/90 pulse- 80 Pt was seen in office yesterday and asked if she was having palpitations. She reports that last night  and today she feels like heart is racing. She now remembers this happened on last Saturday as well. Pt has Hypothyroidism. The doctor had mentioned she may need to adjust her thyroid medication when she was in office on yesterday. Emergent s/s of Irregular Heart beat protocol r/o. Call provider immediately. Message sent in EPIC.  PLEASE CALL PATIENT.

## 2011-09-22 NOTE — Telephone Encounter (Signed)
Patient advised as instructed via telephone, Rx called to [facility].

## 2011-09-22 NOTE — Telephone Encounter (Signed)
Lets decrease synthroid to daily and have her repeat TSH and free t4 in 4 weeks.

## 2011-10-30 ENCOUNTER — Ambulatory Visit: Payer: BC Managed Care – PPO | Admitting: Internal Medicine

## 2011-10-30 ENCOUNTER — Encounter: Payer: Self-pay | Admitting: Internal Medicine

## 2011-11-03 ENCOUNTER — Other Ambulatory Visit: Payer: Self-pay | Admitting: *Deleted

## 2011-11-03 ENCOUNTER — Ambulatory Visit: Payer: BC Managed Care – PPO | Admitting: Internal Medicine

## 2011-11-03 ENCOUNTER — Other Ambulatory Visit (INDEPENDENT_AMBULATORY_CARE_PROVIDER_SITE_OTHER): Payer: BC Managed Care – PPO

## 2011-11-03 ENCOUNTER — Encounter: Payer: Self-pay | Admitting: Internal Medicine

## 2011-11-03 DIAGNOSIS — R300 Dysuria: Secondary | ICD-10-CM

## 2011-11-03 DIAGNOSIS — N390 Urinary tract infection, site not specified: Secondary | ICD-10-CM

## 2011-11-03 DIAGNOSIS — E039 Hypothyroidism, unspecified: Secondary | ICD-10-CM

## 2011-11-03 LAB — POCT URINALYSIS DIPSTICK: Bilirubin, UA: NEGATIVE

## 2011-11-03 MED ORDER — CIPROFLOXACIN HCL 500 MG PO TABS: 500.0000 mg | ORAL_TABLET | Freq: Two times a day (BID) | ORAL | Status: DC

## 2011-11-04 ENCOUNTER — Other Ambulatory Visit: Payer: Self-pay | Admitting: Internal Medicine

## 2011-11-07 LAB — URINE CULTURE

## 2011-11-09 ENCOUNTER — Ambulatory Visit: Payer: BC Managed Care – PPO | Admitting: Internal Medicine

## 2011-11-18 ENCOUNTER — Other Ambulatory Visit: Payer: Self-pay | Admitting: Internal Medicine

## 2011-11-24 ENCOUNTER — Encounter: Payer: Self-pay | Admitting: Internal Medicine

## 2011-11-24 ENCOUNTER — Ambulatory Visit (INDEPENDENT_AMBULATORY_CARE_PROVIDER_SITE_OTHER): Payer: BC Managed Care – PPO | Admitting: Internal Medicine

## 2011-11-24 VITALS — BP 150/100 | HR 75 | Temp 98.4°F | Ht 65.0 in | Wt 188.2 lb

## 2011-11-24 DIAGNOSIS — E669 Obesity, unspecified: Secondary | ICD-10-CM

## 2011-11-24 DIAGNOSIS — E039 Hypothyroidism, unspecified: Secondary | ICD-10-CM

## 2011-11-24 DIAGNOSIS — E559 Vitamin D deficiency, unspecified: Secondary | ICD-10-CM

## 2011-11-24 DIAGNOSIS — F411 Generalized anxiety disorder: Secondary | ICD-10-CM

## 2011-11-24 DIAGNOSIS — Z23 Encounter for immunization: Secondary | ICD-10-CM

## 2011-11-24 DIAGNOSIS — F419 Anxiety disorder, unspecified: Secondary | ICD-10-CM

## 2011-11-24 LAB — COMPREHENSIVE METABOLIC PANEL
BUN: 14 mg/dL (ref 6–23)
CO2: 28 mEq/L (ref 19–32)
Creatinine, Ser: 0.7 mg/dL (ref 0.4–1.2)
GFR: 99.14 mL/min (ref >60.00)
Glucose, Bld: 95 mg/dL (ref 70–99)
Total Bilirubin: 0.8 mg/dL (ref 0.3–1.2)
Total Protein: 7.7 g/dL (ref 6.0–8.3)

## 2011-11-24 LAB — TSH: TSH: 1.49 u[IU]/mL (ref 0.35–5.50)

## 2011-11-24 LAB — T4, FREE: Free T4: 1.2 ng/dL (ref 0.60–1.60)

## 2011-11-24 NOTE — Assessment & Plan Note (Signed)
Given recent increase in anxiety and weight loss, recheck TSH and free T4 today. Levels are still within normal range. We'll continue Synthroid at current dose. Plan to followup in 3 months or sooner as needed.

## 2011-11-24 NOTE — Assessment & Plan Note (Addendum)
Congratulated patient on another 11 pound weight loss. Total weight loss to date 62 pounds. Given that blood pressure is elevated today will have her completely stop phentermine. Will continue keeping food diary using [employer]. Followup in 3 months or sooner as needed. Marland Kitchen

## 2011-11-24 NOTE — Assessment & Plan Note (Signed)
Increase in anxiety recently with preparations for her daughter's upcoming wedding. Discussed potentially using medications such as Xanax to help with symptoms. Patient would prefer to hold off for now. She will call if symptoms are worsening.

## 2011-11-24 NOTE — Progress Notes (Signed)
Subjective:    Patient ID: Kara Spears, female    DOB: 12/12/1960, 51 y.o.   MRN: [id number]  HPI 51 year old female with history of obesity, hypothyroidism, anxiety presents for followup. She reports she has been doing fairly well. In regards to her weight, she continues to keep a record of caloric intake. She has lost another 11 pounds. Total weight loss to date a 62 pounds. She is only sporadically using phentermine to help with appetite suppression. She has not used phentermine within the last week.  After recent visit, she complained of palpitations. She reports that these symptoms have resolved. She does note increased anxiety recently which she attributes to stress surrounding her daughter's upcoming wedding. She does not currently wish to try a medication to help with this.  In regards to hypothyroidism, she reports full compliance with medication. She questions whether dose of thyroid medication may be too high given recent increase in anxiety and palpitations.  Outpatient Encounter Prescriptions as of 11/24/2011  Medication Sig Dispense Refill  . DEXILANT 60 MG capsule TAKE 1 CAPSULE (60 MG TOTAL) BY MOUTH DAILY.  30 capsule  3  . diphenhydrAMINE (BENADRYL ALLERGY) 25 mg capsule Take 25 mg by mouth at bedtime.      Marland Kitchen levothyroxine (SYNTHROID, LEVOTHROID) 75 MCG tablet TAKE 1 TABLET BY MOUTH EVERY DAY  30 tablet  3  . phentermine 37.5 MG capsule Take 1 capsule (37.5 mg total) by mouth every morning.  30 capsule  2   BP 150/100  Pulse 75  Temp 98.4 F (36.9 C) (Oral)  Ht 5\' 5"  (1.651 m)  Wt 188 lb 4 oz (85.39 kg)  BMI 31.33 kg/m2  SpO2 95%  Review of Systems  Constitutional: Negative for fever, chills, appetite change, fatigue and unexpected weight change.  HENT: Negative for ear pain, congestion, sore throat, trouble swallowing, neck pain, voice change and sinus pressure.   Eyes: Negative for visual disturbance.  Respiratory: Negative for cough, shortness of breath,  wheezing and stridor.   Cardiovascular: Positive for palpitations. Negative for chest pain and leg swelling.  Gastrointestinal: Negative for nausea, vomiting, abdominal pain, diarrhea, constipation, blood in stool, abdominal distention and anal bleeding.  Genitourinary: Negative for dysuria and flank pain.  Musculoskeletal: Negative for myalgias, arthralgias and gait problem.  Skin: Negative for color change and rash.  Neurological: Negative for dizziness and headaches.  Hematological: Negative for adenopathy. Does not bruise/bleed easily.  Psychiatric/Behavioral: Positive for dysphoric mood. Negative for suicidal ideas and disturbed wake/sleep cycle. The patient is nervous/anxious.        Objective:   Physical Exam  Constitutional: She is oriented to person, place, and time. She appears well-developed and well-nourished. No distress.  HENT:  Head: Normocephalic and atraumatic.  Right Ear: External ear normal.  Left Ear: External ear normal.  Nose: Nose normal.  Mouth/Throat: Oropharynx is clear and moist. No oropharyngeal exudate.  Eyes: Conjunctivae normal are normal. Pupils are equal, round, and reactive to light. Right eye exhibits no discharge. Left eye exhibits no discharge. No scleral icterus.  Neck: Normal range of motion. Neck supple. No tracheal deviation present. No thyromegaly present.  Cardiovascular: Normal rate, regular rhythm, normal heart sounds and intact distal pulses.  Exam reveals no gallop and no friction rub.   No murmur heard. Pulmonary/Chest: Effort normal and breath sounds normal. No respiratory distress. She has no wheezes. She has no rales. She exhibits no tenderness.  Musculoskeletal: Normal range of motion. She exhibits no edema and no tenderness.  Lymphadenopathy:    She has no cervical adenopathy.  Neurological: She is alert and oriented to person, place, and time. No cranial nerve deficit. She exhibits normal muscle tone. Coordination normal.  Skin: Skin  is warm and dry. No rash noted. She is not diaphoretic. No erythema. No pallor.  Psychiatric: Her behavior is normal. Judgment and thought content normal. Her mood appears anxious.          Assessment & Plan:

## 2011-11-25 LAB — VITAMIN D 25 HYDROXY (VIT D DEFICIENCY, FRACTURES): Vit D, 25-Hydroxy: 29 ng/mL — ABNORMAL LOW (ref 30–89)

## 2011-11-27 ENCOUNTER — Encounter: Payer: Self-pay | Admitting: Internal Medicine

## 2011-11-27 MED ORDER — ALPRAZOLAM 0.25 MG PO TABS: 0.2500 mg | ORAL_TABLET | Freq: Two times a day (BID) | ORAL | Status: DC | PRN

## 2011-11-27 NOTE — Telephone Encounter (Signed)
Rx called to [facility], patient advised via mychart message.

## 2012-01-24 ENCOUNTER — Encounter: Payer: Self-pay | Admitting: Internal Medicine

## 2012-01-31 ENCOUNTER — Ambulatory Visit (INDEPENDENT_AMBULATORY_CARE_PROVIDER_SITE_OTHER): Payer: BC Managed Care – PPO | Admitting: Internal Medicine

## 2012-01-31 ENCOUNTER — Encounter: Payer: Self-pay | Admitting: Internal Medicine

## 2012-01-31 VITALS — BP 128/82 | HR 65 | Temp 98.3°F | Resp 16 | Wt 195.0 lb

## 2012-01-31 DIAGNOSIS — N6320 Unspecified lump in the left breast, unspecified quadrant: Secondary | ICD-10-CM

## 2012-01-31 DIAGNOSIS — N63 Unspecified lump in unspecified breast: Secondary | ICD-10-CM

## 2012-01-31 DIAGNOSIS — Z1331 Encounter for screening for depression: Secondary | ICD-10-CM

## 2012-01-31 NOTE — Progress Notes (Signed)
Subjective:    Patient ID: Kara Spears, female    DOB: 08-10-1960, 51 y.o.   MRN: [id number]  HPI 51 year old female with history of hypothyroidism and obesity presents for acute visit complaining of 4 week history of left breast mass. She reports that she has noted her breast to be more nodular since she has lost weight. She first noticed a nodular area in her left upper outer breast approximately 4 weeks ago. It is most easily palpated with her sitting up. It is not tender to palpation. It has not changed in size. She denies any overlying skin changes or discharge from her nipple. She has a family history of breast cancer in her aunt. However, she has never had a breast biopsy or abnormal mammogram.   Outpatient Encounter Prescriptions as of 01/31/2012  Medication Sig Dispense Refill  . ALPRAZolam (XANAX) 0.25 MG tablet Take 1 tablet (0.25 mg total) by mouth 2 (two) times daily as needed for sleep.  20 tablet  0  . DEXILANT 60 MG capsule TAKE 1 CAPSULE (60 MG TOTAL) BY MOUTH DAILY.  30 capsule  3  . diphenhydrAMINE (BENADRYL ALLERGY) 25 mg capsule Take 25 mg by mouth at bedtime.      Marland Kitchen levothyroxine (SYNTHROID, LEVOTHROID) 75 MCG tablet TAKE 1 TABLET BY MOUTH EVERY DAY  30 tablet  3  . [DISCONTINUED] dexlansoprazole (DEXILANT) 60 MG capsule Take 60 mg by mouth daily.      . [DISCONTINUED] phentermine 37.5 MG capsule Take 1 capsule (37.5 mg total) by mouth every morning.  30 capsule  2   BP 128/82  Pulse 65  Temp 98.3 F (36.8 C) (Oral)  Resp 16  Wt 195 lb (88.451 kg)  Review of Systems  Constitutional: Negative for fever, chills, appetite change, fatigue and unexpected weight change.  HENT: Negative for ear pain, congestion, sore throat, trouble swallowing, neck pain, voice change and sinus pressure.   Eyes: Negative for visual disturbance.  Respiratory: Negative for cough, shortness of breath, wheezing and stridor.   Cardiovascular: Negative for chest pain, palpitations and leg  swelling.  Gastrointestinal: Negative for nausea, vomiting, abdominal pain, diarrhea, constipation, blood in stool, abdominal distention and anal bleeding.  Genitourinary: Negative for dysuria and flank pain.  Musculoskeletal: Negative for myalgias, arthralgias and gait problem.  Skin: Negative for color change and rash.  Neurological: Negative for dizziness and headaches.  Hematological: Negative for adenopathy. Does not bruise/bleed easily.  Psychiatric/Behavioral: Negative for suicidal ideas, sleep disturbance and dysphoric mood. The patient is not nervous/anxious.        Objective:   Physical Exam  Constitutional: She is oriented to person, place, and time. She appears well-developed and well-nourished. No distress.  HENT:  Head: Normocephalic and atraumatic.  Right Ear: External ear normal.  Left Ear: External ear normal.  Nose: Nose normal.  Mouth/Throat: Oropharynx is clear and moist.  Eyes: Conjunctivae normal are normal. Pupils are equal, round, and reactive to light. Right eye exhibits no discharge. Left eye exhibits no discharge. No scleral icterus.  Neck: Normal range of motion. Neck supple. No tracheal deviation present. No thyromegaly present.  Pulmonary/Chest: Effort normal. Right breast exhibits no inverted nipple, no mass, no nipple discharge, no skin change and no tenderness. Left breast exhibits mass. Left breast exhibits no inverted nipple, no nipple discharge, no skin change and no tenderness. Breasts are symmetrical.    Musculoskeletal: Normal range of motion. She exhibits no edema and no tenderness.  Lymphadenopathy:    She has no  cervical adenopathy.  Neurological: She is alert and oriented to person, place, and time. No cranial nerve deficit. She exhibits normal muscle tone. Coordination normal.  Skin: Skin is warm and dry. No rash noted. She is not diaphoretic. No erythema. No pallor.  Psychiatric: She has a normal mood and affect. Her behavior is normal.  Judgment and thought content normal.          Assessment & Plan:

## 2012-01-31 NOTE — Assessment & Plan Note (Signed)
Palpable breast mass noted on exam today. Exam seems most consistent with normal breast tissue. However, given this is a new finding, will schedule diagnostic mammogram and ultrasound.

## 2012-02-19 ENCOUNTER — Encounter: Payer: Self-pay | Admitting: Internal Medicine

## 2012-02-28 ENCOUNTER — Ambulatory Visit: Payer: BC Managed Care – PPO | Admitting: Internal Medicine

## 2012-03-20 ENCOUNTER — Encounter: Payer: Self-pay | Admitting: Internal Medicine

## 2012-03-20 MED ORDER — DEXLANSOPRAZOLE 60 MG PO CPDR: 60.0000 mg | DELAYED_RELEASE_CAPSULE | Freq: Every day | ORAL | Status: DC

## 2012-03-20 MED ORDER — LEVOTHYROXINE SODIUM 75 MCG PO TABS: 75.0000 ug | ORAL_TABLET | Freq: Every day | ORAL | Status: DC

## 2012-03-20 MED ORDER — ALPRAZOLAM 0.25 MG PO TABS: 0.2500 mg | ORAL_TABLET | Freq: Two times a day (BID) | ORAL | Status: DC | PRN

## 2012-07-30 ENCOUNTER — Telehealth: Payer: Self-pay | Admitting: *Deleted

## 2012-07-30 NOTE — Telephone Encounter (Signed)
Pt is coming in for labs tomorrow 06.18.2014 what labs and dx?

## 2012-07-31 ENCOUNTER — Other Ambulatory Visit: Payer: Self-pay | Admitting: *Deleted

## 2012-07-31 ENCOUNTER — Other Ambulatory Visit: Payer: BC Managed Care – PPO

## 2012-07-31 DIAGNOSIS — E038 Other specified hypothyroidism: Secondary | ICD-10-CM

## 2012-07-31 DIAGNOSIS — Z0000 Encounter for general adult medical examination without abnormal findings: Secondary | ICD-10-CM

## 2012-07-31 NOTE — Telephone Encounter (Signed)
CMP, CBC, Lipids, TSH, Free t4 v70.0 and 244.9

## 2012-08-06 ENCOUNTER — Ambulatory Visit: Payer: BC Managed Care – PPO | Admitting: Internal Medicine

## 2012-08-24 ENCOUNTER — Encounter: Payer: Self-pay | Admitting: Internal Medicine

## 2012-10-04 ENCOUNTER — Other Ambulatory Visit (INDEPENDENT_AMBULATORY_CARE_PROVIDER_SITE_OTHER): Payer: BC Managed Care – PPO

## 2012-10-04 DIAGNOSIS — E038 Other specified hypothyroidism: Secondary | ICD-10-CM

## 2012-10-04 DIAGNOSIS — Z0000 Encounter for general adult medical examination without abnormal findings: Secondary | ICD-10-CM

## 2012-10-04 LAB — CBC WITH DIFFERENTIAL/PLATELET
Basophils Absolute: 0 10*3/uL (ref 0.0–0.1)
Lymphocytes Relative: 24.9 % (ref 12.0–46.0)
Monocytes Relative: 7.3 % (ref 3.0–12.0)
Platelets: 250 10*3/uL (ref 150.0–400.0)
RDW: 15.3 % — ABNORMAL HIGH (ref 11.5–14.6)

## 2012-10-04 LAB — COMPREHENSIVE METABOLIC PANEL
ALT: 22 U/L (ref 0–35)
Albumin: 3.7 g/dL (ref 3.5–5.2)
CO2: 28 mEq/L (ref 19–32)
Calcium: 9.1 mg/dL (ref 8.4–10.5)
Chloride: 107 mEq/L (ref 96–112)
GFR: 100.52 mL/min (ref >60.00)
Sodium: 139 mEq/L (ref 135–145)
Total Protein: 6.8 g/dL (ref 6.0–8.3)

## 2012-10-04 LAB — LIPID PANEL
Cholesterol: 222 mg/dL — ABNORMAL HIGH (ref 0–200)
HDL: 36 mg/dL — ABNORMAL LOW (ref >39.00)
VLDL: 48 mg/dL — ABNORMAL HIGH (ref 0.0–40.0)

## 2012-10-04 LAB — LDL CHOLESTEROL, DIRECT: Direct LDL: 143.2 mg/dL

## 2012-10-22 ENCOUNTER — Ambulatory Visit (INDEPENDENT_AMBULATORY_CARE_PROVIDER_SITE_OTHER): Payer: BC Managed Care – PPO | Admitting: Internal Medicine

## 2012-10-22 ENCOUNTER — Encounter: Payer: Self-pay | Admitting: Internal Medicine

## 2012-10-22 VITALS — BP 126/84 | HR 76 | Temp 98.1°F | Resp 14 | Wt 219.5 lb

## 2012-10-22 DIAGNOSIS — F411 Generalized anxiety disorder: Secondary | ICD-10-CM

## 2012-10-22 DIAGNOSIS — F419 Anxiety disorder, unspecified: Secondary | ICD-10-CM

## 2012-10-22 DIAGNOSIS — Z23 Encounter for immunization: Secondary | ICD-10-CM

## 2012-10-22 DIAGNOSIS — D649 Anemia, unspecified: Secondary | ICD-10-CM | POA: Insufficient documentation

## 2012-10-22 DIAGNOSIS — E039 Hypothyroidism, unspecified: Secondary | ICD-10-CM

## 2012-10-22 DIAGNOSIS — E669 Obesity, unspecified: Secondary | ICD-10-CM

## 2012-10-22 MED ORDER — PHENTERMINE HCL 37.5 MG PO CAPS: 37.5000 mg | ORAL_CAPSULE | ORAL | Status: DC

## 2012-10-22 NOTE — Assessment & Plan Note (Signed)
Recent increased stress with family issues. Offered support today. Will continue prn alprazolam.

## 2012-10-22 NOTE — Assessment & Plan Note (Signed)
Reviewed recent thyroid function which was normal. Will continue current dose of Levothyroxine.

## 2012-10-22 NOTE — Assessment & Plan Note (Signed)
Wt Readings from Last 3 Encounters:  10/22/12 219 lb 8 oz (99.565 kg)  01/31/12 195 lb (88.451 kg)  11/24/11 188 lb 4 oz (85.39 kg)   Will restart phentermine to help with appetite suppression. Encouraged use of My[employer] to help record caloric intake. Encouraged continued effort at regular physical activity.

## 2012-10-22 NOTE — Assessment & Plan Note (Signed)
Recent mild anemia noted on labs. No recent blood loss noted. Will check stool hemoccult. Will repeat CBC in 4 weeks.

## 2012-10-22 NOTE — Progress Notes (Signed)
Subjective:    Patient ID: Kara Spears, female    DOB: [birth year], 52 y.o.   MRN: [id number]  HPI 52YO female with h/o hypothyroidism, obesity and anxiety presents for follow up. Notes recent increased stress with passing of two close relatives and with other ongoing family stressors. Has not been following healthy diet. Having trouble getting motivated to record caloric intake as before. Recently started back at local gym. Would like to start back on phentermine to help with appetite.  Outpatient Prescriptions Prior to Visit  Medication Sig Dispense Refill  . dexlansoprazole (DEXILANT) 60 MG capsule Take 1 capsule (60 mg total) by mouth daily.  30 capsule  11  . diphenhydrAMINE (BENADRYL ALLERGY) 25 mg capsule Take 25 mg by mouth at bedtime.      Marland Kitchen levothyroxine (SYNTHROID, LEVOTHROID) 75 MCG tablet Take 1 tablet (75 mcg total) by mouth daily.  30 tablet  11  . ALPRAZolam (XANAX) 0.25 MG tablet Take 1 tablet (0.25 mg total) by mouth 2 (two) times daily as needed for sleep or anxiety.  60 tablet  3   No facility-administered medications prior to visit.   BP 126/84  Pulse 76  Temp(Src) 98.1 F (36.7 C) (Oral)  Resp 14  Wt 219 lb 8 oz (99.565 kg)  BMI 36.53 kg/m2  SpO2 98%  Review of Systems  Constitutional: Positive for fatigue. Negative for fever, chills, appetite change and unexpected weight change.  HENT: Negative for ear pain, congestion, sore throat, trouble swallowing, neck pain, voice change and sinus pressure.   Eyes: Negative for visual disturbance.  Respiratory: Negative for cough, shortness of breath, wheezing and stridor.   Cardiovascular: Negative for chest pain, palpitations and leg swelling.  Gastrointestinal: Negative for nausea, vomiting, abdominal pain, diarrhea, constipation, blood in stool, abdominal distention and anal bleeding.  Genitourinary: Negative for dysuria and flank pain.  Musculoskeletal: Negative for myalgias, arthralgias and gait problem.  Skin:  Negative for color change and rash.  Neurological: Negative for dizziness and headaches.  Hematological: Negative for adenopathy. Does not bruise/bleed easily.  Psychiatric/Behavioral: Negative for suicidal ideas, sleep disturbance and dysphoric mood. The patient is nervous/anxious.        Objective:   Physical Exam  Constitutional: She is oriented to person, place, and time. She appears well-developed and well-nourished. No distress.  HENT:  Head: Normocephalic and atraumatic.  Right Ear: External ear normal.  Left Ear: External ear normal.  Nose: Nose normal.  Mouth/Throat: Oropharynx is clear and moist. No oropharyngeal exudate.  Eyes: Conjunctivae are normal. Pupils are equal, round, and reactive to light. Right eye exhibits no discharge. Left eye exhibits no discharge. No scleral icterus.  Neck: Normal range of motion. Neck supple. No tracheal deviation present. No thyromegaly present.  Cardiovascular: Normal rate, regular rhythm, normal heart sounds and intact distal pulses.  Exam reveals no gallop and no friction rub.   No murmur heard. Pulmonary/Chest: Effort normal and breath sounds normal. No accessory muscle usage. Not tachypneic. No respiratory distress. She has no decreased breath sounds. She has no wheezes. She has no rhonchi. She has no rales. She exhibits no tenderness.  Musculoskeletal: Normal range of motion. She exhibits no edema and no tenderness.  Lymphadenopathy:    She has no cervical adenopathy.  Neurological: She is alert and oriented to person, place, and time. No cranial nerve deficit. She exhibits normal muscle tone. Coordination normal.  Skin: Skin is warm and dry. No rash noted. She is not diaphoretic. No erythema. No pallor.  Psychiatric: Her behavior is normal. Judgment and thought content normal. Her mood appears anxious.          Assessment & Plan:

## 2012-11-11 ENCOUNTER — Telehealth: Payer: Self-pay | Admitting: *Deleted

## 2012-11-11 NOTE — Telephone Encounter (Signed)
I think she only needs FOBT testing for anemia

## 2012-11-11 NOTE — Telephone Encounter (Signed)
Pt is coming in for labs tomorrow what labs and dx?  

## 2012-11-12 ENCOUNTER — Other Ambulatory Visit: Payer: BC Managed Care – PPO

## 2012-11-20 ENCOUNTER — Telehealth: Payer: Self-pay | Admitting: *Deleted

## 2012-11-20 DIAGNOSIS — D649 Anemia, unspecified: Secondary | ICD-10-CM

## 2012-11-20 NOTE — Telephone Encounter (Signed)
Pt is coming in for labs what labs and dx?

## 2012-11-20 NOTE — Telephone Encounter (Signed)
This is Dr Tilman Neat pt and the only thing I could find is f/u hgb in 4 weeks.  Orders placed for labs.  Thanks.

## 2012-11-21 ENCOUNTER — Other Ambulatory Visit (INDEPENDENT_AMBULATORY_CARE_PROVIDER_SITE_OTHER): Payer: BC Managed Care – PPO

## 2012-11-21 DIAGNOSIS — D649 Anemia, unspecified: Secondary | ICD-10-CM

## 2012-11-21 LAB — FERRITIN: Ferritin: 28.5 ng/mL (ref 10.0–291.0)

## 2012-11-21 LAB — CBC WITH DIFFERENTIAL/PLATELET
Basophils Absolute: 0 10*3/uL (ref 0.0–0.1)
Basophils Relative: 0.8 % (ref 0.0–3.0)
HCT: 37.6 % (ref 36.0–46.0)
Lymphs Abs: 1.7 10*3/uL (ref 0.7–4.0)
MCV: 78.4 fl (ref 78.0–100.0)
Monocytes Absolute: 0.5 10*3/uL (ref 0.1–1.0)
Monocytes Relative: 8.8 % (ref 3.0–12.0)
Platelets: 281 10*3/uL (ref 150.0–400.0)
RDW: 14.7 % — ABNORMAL HIGH (ref 11.5–14.6)
WBC: 5.7 10*3/uL (ref 4.5–10.5)

## 2012-11-21 LAB — IBC PANEL
Iron: 62 ug/dL (ref 42–145)
Saturation Ratios: 16 % — ABNORMAL LOW (ref 20.0–50.0)

## 2012-11-26 ENCOUNTER — Ambulatory Visit: Payer: BC Managed Care – PPO | Admitting: Internal Medicine

## 2012-12-17 ENCOUNTER — Encounter: Payer: Self-pay | Admitting: Internal Medicine

## 2012-12-17 ENCOUNTER — Ambulatory Visit (INDEPENDENT_AMBULATORY_CARE_PROVIDER_SITE_OTHER): Payer: BC Managed Care – PPO | Admitting: Internal Medicine

## 2012-12-17 VITALS — BP 110/86 | HR 87 | Temp 98.2°F | Wt 208.0 lb

## 2012-12-17 DIAGNOSIS — E785 Hyperlipidemia, unspecified: Secondary | ICD-10-CM

## 2012-12-17 DIAGNOSIS — E669 Obesity, unspecified: Secondary | ICD-10-CM

## 2012-12-17 DIAGNOSIS — E039 Hypothyroidism, unspecified: Secondary | ICD-10-CM

## 2012-12-17 DIAGNOSIS — J0100 Acute maxillary sinusitis, unspecified: Secondary | ICD-10-CM

## 2012-12-17 DIAGNOSIS — E8881 Metabolic syndrome: Secondary | ICD-10-CM

## 2012-12-17 MED ORDER — AMOXICILLIN-POT CLAVULANATE 875-125 MG PO TABS: 1.0000 | ORAL_TABLET | Freq: Two times a day (BID) | ORAL | Status: DC

## 2012-12-17 MED ORDER — PHENTERMINE HCL 37.5 MG PO CAPS: 37.5000 mg | ORAL_CAPSULE | ORAL | Status: DC

## 2012-12-17 NOTE — Assessment & Plan Note (Signed)
Symptoms and exam consistent with maxillary sinusitis. Will treat with augmentin. Rest. Prn Ibuprofen and antihistamines. Pt will call if symptoms are not improving over next few days. Follow up prn.

## 2012-12-17 NOTE — Progress Notes (Signed)
Pre-visit discussion using our clinic review tool. No additional management support is needed unless otherwise documented below in the visit note.  

## 2012-12-17 NOTE — Assessment & Plan Note (Signed)
Reviewed recent thyroid function which was normal. Continue levothyroxine. Follow up with repeat labs in 3 months, given recent weight change.

## 2012-12-17 NOTE — Progress Notes (Signed)
Subjective:    Patient ID: Kara Spears, female    DOB: 1960-04-12, 52 y.o.   MRN: [id number]  HPI 52YO female with h/o hypothyroidism, obesity presents for follow up. Feeling well. Compliant with healthy diet and trying to get regular exercise. Has lost 11lbs since her last visit. Using phentermine for appetite supression. No side effects noted. In regards to hypothyroidism, compliant with medication. No recent fatigue, temperature intolerance, constipation. Notes 1-2 week h/o purulent nasal drainage, congestion and sinus pressure. No fever, chills. No cough, dyspnea. Taking OTC cold medications with no improvement.  Outpatient Encounter Prescriptions as of 12/17/2012  Medication Sig  . ALPRAZolam (XANAX) 0.25 MG tablet Take 1 tablet (0.25 mg total) by mouth 2 (two) times daily as needed for sleep or anxiety.  Marland Kitchen dexlansoprazole (DEXILANT) 60 MG capsule Take 1 capsule (60 mg total) by mouth daily.  Marland Kitchen levothyroxine (SYNTHROID, LEVOTHROID) 75 MCG tablet Take 1 tablet (75 mcg total) by mouth daily.  . phentermine 37.5 MG capsule Take 1 capsule (37.5 mg total) by mouth every morning.   BP 110/86  Pulse 87  Temp(Src) 98.2 F (36.8 C) (Oral)  Wt 208 lb (94.348 kg)  SpO2 96%   Review of Systems  Constitutional: Positive for fatigue. Negative for fever, chills and unexpected weight change.  HENT: Positive for congestion and sinus pressure. Negative for ear discharge, ear pain, facial swelling, hearing loss, mouth sores, nosebleeds, postnasal drip, rhinorrhea, sneezing, sore throat, tinnitus, trouble swallowing and voice change.   Eyes: Negative for pain, discharge, redness and visual disturbance.  Respiratory: Negative for cough, chest tightness, shortness of breath, wheezing and stridor.   Cardiovascular: Negative for chest pain, palpitations and leg swelling.  Musculoskeletal: Negative for arthralgias, myalgias, neck pain and neck stiffness.  Skin: Negative for color change and rash.   Neurological: Negative for dizziness, weakness, light-headedness and headaches.  Hematological: Negative for adenopathy.       Objective:   Physical Exam  Constitutional: She is oriented to person, place, and time. She appears well-developed and well-nourished. No distress.  HENT:  Head: Normocephalic and atraumatic.  Right Ear: External ear normal. A middle ear effusion is present.  Left Ear: External ear normal. A middle ear effusion is present.  Nose: Mucosal edema present. Right sinus exhibits maxillary sinus tenderness and frontal sinus tenderness. Left sinus exhibits maxillary sinus tenderness and frontal sinus tenderness.  Mouth/Throat: Oropharynx is clear and moist. No oropharyngeal exudate.  Eyes: Conjunctivae are normal. Pupils are equal, round, and reactive to light. Right eye exhibits no discharge. Left eye exhibits no discharge. No scleral icterus.  Neck: Normal range of motion. Neck supple. No tracheal deviation present. No thyromegaly present.  Cardiovascular: Normal rate, regular rhythm, normal heart sounds and intact distal pulses.  Exam reveals no gallop and no friction rub.   No murmur heard. Pulmonary/Chest: Effort normal and breath sounds normal. No accessory muscle usage. Not tachypneic. No respiratory distress. She has no decreased breath sounds. She has no wheezes. She has no rhonchi. She has no rales. She exhibits no tenderness.  Musculoskeletal: Normal range of motion. She exhibits no edema and no tenderness.  Lymphadenopathy:    She has no cervical adenopathy.  Neurological: She is alert and oriented to person, place, and time. No cranial nerve deficit. She exhibits normal muscle tone. Coordination normal.  Skin: Skin is warm and dry. No rash noted. She is not diaphoretic. No erythema. No pallor.  Psychiatric: She has a normal mood and affect. Her behavior is  normal. Judgment and thought content normal.          Assessment & Plan:

## 2012-12-17 NOTE — Assessment & Plan Note (Signed)
Elevated BG>100, central obesity, and elevated triglycerides and low HDL consistent with metabolic syndrome. Will continue with efforts at healthy diet, weight loss, regular exercise. Based on ASCVD 10 year risk calculator <7.5%, no indication for statin medication at this time. Reviewed numbers today with pt.

## 2012-12-17 NOTE — Assessment & Plan Note (Signed)
Wt Readings from Last 3 Encounters:  12/17/12 208 lb (94.348 kg)  10/22/12 219 lb 8 oz (99.565 kg)  01/31/12 195 lb (88.451 kg)   Congratulated pt on weight loss 11lb. Encouraged her to continue caloric restriction and regular exercise. Will continue phentermine for appetite suppression. Follow up 3 months and prn.

## 2013-03-18 ENCOUNTER — Other Ambulatory Visit: Payer: BC Managed Care – PPO

## 2013-03-18 ENCOUNTER — Other Ambulatory Visit: Payer: Self-pay | Admitting: Internal Medicine

## 2013-03-25 ENCOUNTER — Ambulatory Visit: Payer: BC Managed Care – PPO | Admitting: Internal Medicine

## 2013-04-01 ENCOUNTER — Ambulatory Visit: Payer: BC Managed Care – PPO | Admitting: Internal Medicine

## 2013-04-15 ENCOUNTER — Other Ambulatory Visit: Payer: BC Managed Care – PPO

## 2013-04-16 ENCOUNTER — Other Ambulatory Visit: Payer: Self-pay | Admitting: Internal Medicine

## 2013-04-18 ENCOUNTER — Encounter: Payer: Self-pay | Admitting: Internal Medicine

## 2013-04-29 ENCOUNTER — Ambulatory Visit: Payer: BC Managed Care – PPO | Admitting: Internal Medicine

## 2013-06-02 ENCOUNTER — Encounter: Payer: Self-pay | Admitting: Internal Medicine

## 2013-06-03 ENCOUNTER — Other Ambulatory Visit (INDEPENDENT_AMBULATORY_CARE_PROVIDER_SITE_OTHER): Payer: BC Managed Care – PPO

## 2013-06-03 DIAGNOSIS — E785 Hyperlipidemia, unspecified: Secondary | ICD-10-CM

## 2013-06-03 DIAGNOSIS — E039 Hypothyroidism, unspecified: Secondary | ICD-10-CM

## 2013-06-03 LAB — LIPID PANEL
CHOLESTEROL: 226 mg/dL — AB (ref 0–200)
HDL: 35.4 mg/dL — AB (ref >39.00)
LDL Cholesterol: 149 mg/dL — ABNORMAL HIGH (ref 0–99)
Total CHOL/HDL Ratio: 6
Triglycerides: 208 mg/dL — ABNORMAL HIGH (ref 0.0–149.0)
VLDL: 41.6 mg/dL — ABNORMAL HIGH (ref 0.0–40.0)

## 2013-06-03 LAB — COMPREHENSIVE METABOLIC PANEL
ALT: 20 U/L (ref 0–35)
AST: 20 U/L (ref 0–37)
Albumin: 3.7 g/dL (ref 3.5–5.2)
Alkaline Phosphatase: 55 U/L (ref 39–117)
BUN: 10 mg/dL (ref 6–23)
CO2: 28 meq/L (ref 19–32)
CREATININE: 0.6 mg/dL (ref 0.4–1.2)
Calcium: 9.3 mg/dL (ref 8.4–10.5)
Chloride: 105 mEq/L (ref 96–112)
GFR: 103.88 mL/min (ref >60.00)
GLUCOSE: 97 mg/dL (ref 70–99)
Potassium: 4.1 mEq/L (ref 3.5–5.1)
Sodium: 140 mEq/L (ref 135–145)
Total Bilirubin: 0.7 mg/dL (ref 0.3–1.2)
Total Protein: 6.9 g/dL (ref 6.0–8.3)

## 2013-06-03 LAB — TSH: TSH: 3.23 u[IU]/mL (ref 0.35–5.50)

## 2013-06-05 ENCOUNTER — Ambulatory Visit (INDEPENDENT_AMBULATORY_CARE_PROVIDER_SITE_OTHER): Payer: BC Managed Care – PPO | Admitting: Internal Medicine

## 2013-06-05 ENCOUNTER — Encounter: Payer: Self-pay | Admitting: Internal Medicine

## 2013-06-05 VITALS — BP 122/76 | HR 60 | Resp 16 | Wt 218.8 lb

## 2013-06-05 DIAGNOSIS — M79641 Pain in right hand: Secondary | ICD-10-CM | POA: Insufficient documentation

## 2013-06-05 DIAGNOSIS — J309 Allergic rhinitis, unspecified: Secondary | ICD-10-CM

## 2013-06-05 DIAGNOSIS — E039 Hypothyroidism, unspecified: Secondary | ICD-10-CM

## 2013-06-05 DIAGNOSIS — K921 Melena: Secondary | ICD-10-CM

## 2013-06-05 DIAGNOSIS — M79609 Pain in unspecified limb: Secondary | ICD-10-CM

## 2013-06-05 MED ORDER — BECLOMETHASONE DIPROPIONATE 80 MCG/ACT NA AERS: 1.0000 | INHALATION_SPRAY | Freq: Every day | NASAL | Status: DC

## 2013-06-05 NOTE — Assessment & Plan Note (Signed)
Recent episodes of hematochezia, ongoing 1-2 months. Exam normal. Suspect internal hemorrhoid. Will set up GI evaluation for possible colonoscopy or flex sig. Will check CBC and ferritin with labs.

## 2013-06-05 NOTE — Assessment & Plan Note (Addendum)
Right fifth distal metacarpal pain. Unclear etiology. Exam normal. Will get plain film to evaluate for fracture. If pain persistent, consider MRI.

## 2013-06-05 NOTE — Progress Notes (Signed)
Subjective:    Patient ID: Kara Spears, female    DOB: 06/12/1960, 53 y.o.   MRN: [id number]  HPI 53YO female presents for follow up.  Hematochezia - 1-2 months. Bright red blood in stool and between stools. No rectal pain, abdominal pain, nausea. Occurs intermittently, with normal stools in between. Last colonoscopy 2 years ago. No hemorrhoids. No consistent constipation. No black tarry stools.  Cough/choking sensation - When sitting up, notes increased mucous and coughing. No trouble swallowing. Some sneezing, nasal drainage. Tried claritin with no improvement. Zyrtec too sedating. Sudafed PE is helpful.  Trigger finger - right fifth finger pain described as aching laterally. Much worse in the mornings.No trauma to hand. No weakness noted.   Review of Systems  Constitutional: Negative for fever, chills, appetite change, fatigue and unexpected weight change.  HENT: Positive for congestion, postnasal drip, rhinorrhea and sneezing. Negative for ear pain, sinus pressure, sore throat, trouble swallowing and voice change.   Eyes: Negative for visual disturbance.  Respiratory: Positive for cough. Negative for shortness of breath, wheezing and stridor.   Cardiovascular: Negative for chest pain, palpitations and leg swelling.  Gastrointestinal: Positive for blood in stool. Negative for nausea, vomiting, abdominal pain, diarrhea, constipation, abdominal distention and anal bleeding.  Genitourinary: Negative for dysuria and flank pain.  Musculoskeletal: Positive for arthralgias. Negative for gait problem, myalgias and neck pain.  Skin: Negative for color change and rash.  Neurological: Negative for dizziness and headaches.  Hematological: Negative for adenopathy. Does not bruise/bleed easily.  Psychiatric/Behavioral: Negative for suicidal ideas, sleep disturbance and dysphoric mood. The patient is not nervous/anxious.        Objective:    BP 122/76  Pulse 60  Resp 16  Wt 218 lb 12.8  oz (99.247 kg)  SpO2 97% Physical Exam  Constitutional: She is oriented to person, place, and time. She appears well-developed and well-nourished. No distress.  HENT:  Head: Normocephalic and atraumatic.  Right Ear: External ear normal.  Left Ear: External ear normal.  Nose: Nose normal.  Mouth/Throat: Oropharynx is clear and moist. No oropharyngeal exudate.  Eyes: Conjunctivae are normal. Pupils are equal, round, and reactive to light. Right eye exhibits no discharge. Left eye exhibits no discharge. No scleral icterus.  Neck: Normal range of motion. Neck supple. No tracheal deviation present. No thyromegaly present.  Cardiovascular: Normal rate, regular rhythm, normal heart sounds and intact distal pulses.  Exam reveals no gallop and no friction rub.   No murmur heard. Pulmonary/Chest: Effort normal and breath sounds normal. No accessory muscle usage. Not tachypneic. No respiratory distress. She has no decreased breath sounds. She has no wheezes. She has no rhonchi. She has no rales. She exhibits no tenderness.  Abdominal: Soft. Normal appearance and bowel sounds are normal. There is no hepatosplenomegaly. There is no tenderness.  Musculoskeletal: Normal range of motion. She exhibits no edema and no tenderness.       Right hand: Normal.       Hands: Lymphadenopathy:    She has no cervical adenopathy.  Neurological: She is alert and oriented to person, place, and time. No cranial nerve deficit. She exhibits normal muscle tone. Coordination normal.  Skin: Skin is warm and dry. No rash noted. She is not diaphoretic. No erythema. No pallor.  Psychiatric: She has a normal mood and affect. Her behavior is normal. Judgment and thought content normal.          Assessment & Plan:   Problem List Items Addressed This Visit  Allergic rhinitis     Symptoms consistent with allergic rhinitis. Unable to tolerate non-sedating antihistamines because of sedation. Will start nasal steroid, Qnasl,  daily. Will continue prn sudafed. Follow up 4 weeks and prn. If no improvement, then consider referral to allergist.    Relevant Medications      Beclomethasone Dipropionate (QNASL) 80 MCG/ACT AERS   Hematochezia - Primary     Recent episodes of hematochezia, ongoing 1-2 months. Exam normal. Suspect internal hemorrhoid. Will set up GI evaluation for possible colonoscopy or flex sig. Will check CBC and ferritin with labs.    Relevant Orders      Ambulatory referral to Gastroenterology      CBC w/Diff      Ferritin   Hypothyroidism     Recent thyroid function normal. Continue Levothyroxine.    Right hand pain     Right fifth distal metacarpal pain. Unclear etiology. Exam normal. Will get plain film to evaluate for fracture. If pain persistent, consider MRI.    Relevant Orders      DG Hand Complete Right       Return in about 4 weeks (around 07/03/2013).

## 2013-06-05 NOTE — Patient Instructions (Addendum)
Please go to our [facility] for [employer] of right hand tomorrow and lab work.  We will set up referral with Dr. Hilarie Fredrickson in GI.  Start Qnasl 1 spray each nostril daily.  Follow up here in 4 weeks.

## 2013-06-05 NOTE — Progress Notes (Signed)
Pre visit review using our clinic review tool, if applicable. No additional management support is needed unless otherwise documented below in the visit note. 

## 2013-06-05 NOTE — Assessment & Plan Note (Signed)
Symptoms consistent with allergic rhinitis. Unable to tolerate non-sedating antihistamines because of sedation. Will start nasal steroid, Qnasl, daily. Will continue prn sudafed. Follow up 4 weeks and prn. If no improvement, then consider referral to allergist.

## 2013-06-05 NOTE — Assessment & Plan Note (Signed)
Recent thyroid function normal. Continue Levothyroxine.

## 2013-06-06 ENCOUNTER — Encounter: Payer: Self-pay | Admitting: Internal Medicine

## 2013-06-06 ENCOUNTER — Other Ambulatory Visit (INDEPENDENT_AMBULATORY_CARE_PROVIDER_SITE_OTHER): Payer: BC Managed Care – PPO

## 2013-06-06 ENCOUNTER — Ambulatory Visit (INDEPENDENT_AMBULATORY_CARE_PROVIDER_SITE_OTHER)
Admission: RE | Admit: 2013-06-06 | Discharge: 2013-06-06 | Disposition: A | Discharge status: Home / Self Care | Payer: BC Managed Care – PPO | Source: Ambulatory Visit | Attending: Internal Medicine | Admitting: Internal Medicine

## 2013-06-06 ENCOUNTER — Encounter: Payer: Self-pay | Admitting: Emergency Medicine

## 2013-06-06 DIAGNOSIS — M79641 Pain in right hand: Secondary | ICD-10-CM

## 2013-06-06 DIAGNOSIS — M79609 Pain in unspecified limb: Secondary | ICD-10-CM

## 2013-06-06 DIAGNOSIS — K921 Melena: Secondary | ICD-10-CM

## 2013-06-06 LAB — CBC WITH DIFFERENTIAL/PLATELET
BASOS ABS: 0 10*3/uL (ref 0.0–0.1)
Basophils Relative: 0.5 % (ref 0.0–3.0)
Eosinophils Absolute: 0.2 10*3/uL (ref 0.0–0.7)
Eosinophils Relative: 3.2 % (ref 0.0–5.0)
HCT: 37.6 % (ref 36.0–46.0)
HEMOGLOBIN: 12.2 g/dL (ref 12.0–15.0)
LYMPHS PCT: 28.1 % (ref 12.0–46.0)
Lymphs Abs: 1.7 10*3/uL (ref 0.7–4.0)
MCHC: 32.5 g/dL (ref 30.0–36.0)
MCV: 81.4 fl (ref 78.0–100.0)
MONOS PCT: 7.2 % (ref 3.0–12.0)
Monocytes Absolute: 0.4 10*3/uL (ref 0.1–1.0)
NEUTROS ABS: 3.8 10*3/uL (ref 1.4–7.7)
Neutrophils Relative %: 61 % (ref 43.0–77.0)
PLATELETS: 281 10*3/uL (ref 150.0–400.0)
RBC: 4.62 Mil/uL (ref 3.87–5.11)
RDW: 15.2 % — ABNORMAL HIGH (ref 11.5–14.6)
WBC: 6.2 10*3/uL (ref 4.5–10.5)

## 2013-06-09 LAB — FERRITIN: FERRITIN: 26.3 ng/mL (ref 10.0–291.0)

## 2013-06-29 ENCOUNTER — Ambulatory Visit: Payer: Self-pay | Admitting: Family Medicine

## 2013-06-29 LAB — URINALYSIS, COMPLETE
Bilirubin,UR: NEGATIVE
Glucose,UR: NEGATIVE mg/dL (ref 0–75)
Ketone: NEGATIVE
Nitrite: NEGATIVE
Ph: 6 (ref 4.5–8.0)
Protein: NEGATIVE
Specific Gravity: 1.01 (ref 1.003–1.030)

## 2013-07-01 LAB — URINE CULTURE

## 2013-07-02 ENCOUNTER — Ambulatory Visit: Payer: Self-pay | Admitting: Internal Medicine

## 2013-07-03 ENCOUNTER — Telehealth: Payer: Self-pay | Admitting: Internal Medicine

## 2013-07-03 DIAGNOSIS — M25539 Pain in unspecified wrist: Secondary | ICD-10-CM

## 2013-07-03 NOTE — Telephone Encounter (Signed)
MRI of the wrist showed mild inflammation in one of the extensor tendons. I would recommend evaluation with sports medicine if her symptoms of pain are persistent.

## 2013-07-04 ENCOUNTER — Encounter: Payer: Self-pay | Admitting: Internal Medicine

## 2013-07-04 DIAGNOSIS — M79643 Pain in unspecified hand: Secondary | ICD-10-CM

## 2013-07-09 NOTE — Telephone Encounter (Signed)
Pt states that she would like a referral to sports medicine

## 2013-07-21 ENCOUNTER — Ambulatory Visit (INDEPENDENT_AMBULATORY_CARE_PROVIDER_SITE_OTHER): Payer: BC Managed Care – PPO | Admitting: Family Medicine

## 2013-07-21 ENCOUNTER — Encounter: Payer: Self-pay | Admitting: Family Medicine

## 2013-07-21 ENCOUNTER — Other Ambulatory Visit (INDEPENDENT_AMBULATORY_CARE_PROVIDER_SITE_OTHER): Payer: BC Managed Care – PPO

## 2013-07-21 VITALS — BP 126/86 | HR 84 | Ht 65.0 in | Wt 221.0 lb

## 2013-07-21 DIAGNOSIS — M79609 Pain in unspecified limb: Secondary | ICD-10-CM

## 2013-07-21 DIAGNOSIS — M79641 Pain in right hand: Secondary | ICD-10-CM

## 2013-07-21 DIAGNOSIS — G5620 Lesion of ulnar nerve, unspecified upper limb: Secondary | ICD-10-CM | POA: Insufficient documentation

## 2013-07-21 MED ORDER — MELOXICAM 15 MG PO TABS: 15.0000 mg | ORAL_TABLET | Freq: Every day | ORAL | Status: DC

## 2013-07-21 NOTE — Progress Notes (Signed)
[facility] [address], [place] [address] Phone: [phone] Subjective:    I'm seeing this patient by the request  of:  Rica Mast, MD   CC: Right hand pain  [id number] Kara Spears is a 53 y.o. female coming in with complaint of right hand pain. Patient states that she's had this pain for multiple months. Patient does not remember any true injury. Patient states the pain seems to hurt more on the ulnar aspect of the wrist. Patient states it actually can be associated with numbness as well. Patient states when she wakes up in the morning it seems to be significantly worse. Patient denies any numbness or tingling with activity to. 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 Patient did see primary care provider who did get x-rays the hand which was unremarkable and then had a MRI that was reviewed by me. Patient's MRI only showed some nonspecific extensor tendinitis. Patient rates the severity of 9/10 when it occurs at night. Patient has only tried some over-the-counter medications without any significant improvement. Patient is more concerned would like to know what it is and what can be done about it.     Past medical history, social, surgical and family history all reviewed in electronic medical record.   Review of Systems: No headache, visual changes, nausea, vomiting, diarrhea, constipation, dizziness, abdominal pain, skin rash, fevers, chills, night sweats, weight loss, swollen lymph nodes, body aches, joint swelling, muscle aches, chest pain, shortness of breath, mood changes.   Objective Blood pressure 126/86, pulse 84, height 5\' 5"  (1.651 m), weight 221 lb (100.245 kg), SpO2 97.00%.  General: No apparent distress alert and oriented x3 mood and affect normal, dressed appropriately.  HEENT: Pupils equal, extraocular movements intact  Respiratory: Patient's speak in full sentences and  does not appear short of breath  Cardiovascular: No lower extremity edema, non tender, no erythema  Skin: Warm dry intact with no signs of infection or rash on extremities or on axial skeleton.  Abdomen: Soft nontender  Neuro: Cranial nerves II through XII are intact, neurovascularly intact in all extremities with 2+ DTRs and 2+ pulses.  Lymph: No lymphadenopathy of posterior or anterior cervical chain or axillae bilaterally.  Gait normal with good balance and coordination.  MSK:  Non tender with full range of motion and good stability and symmetric strength and tone of shoulders, elbows,  hip, knee and ankles bilaterally.  Wrist: Right Inspection normal with no visible erythema or swelling. ROM smooth and normal with good flexion and extension and ulnar/radial deviation that is symmetrical with opposite wrist. Palpation is normal over metacarpals, navicular, lunate, and the patient does have some mild tenderness over the ulnar aspect near the TFCC as well as the Guyon's canal which does with compression give patient the sensation she discusses. No snuffbox tenderness. Positive tenderness over Canal of Guyon. Strength 5/5 in all directions without pain. Negative Finkelstein, tinel's and phalens. Negative Watson's test. Contralateral wrist unremarkable  MSK US performed of: Right This study was ordered, performed, and interpreted by Charlann Boxer D.O.  Wrist: All extensor compartments visualized and tendons all normal in appearance without fraying, tears, or sheath effusions. No effusion seen. TFCC intact. Does have hypoechoic changes where the ulnar nerve has significant contact. Scapholunate ligament intact. Carpal tunnel visualized and median nerve area normal, flexor tendons all normal in appearance without fraying, tears, or sheath effusions. Power doppler signal normal.  IMPRESSION: Ulnar  neuropathy.      Impression and Recommendations:     This case required medical decision  making of moderate complexity.

## 2013-07-21 NOTE — Patient Instructions (Signed)
Good to meet you Wear brace day and night for 2 weeks then nightly for 2 weeks Exercises in 96 hours and then do 3 times a week Ice 20 minutes 2 times a day Vitamin D 2000mg  daily Turmeric 500mg  twice daily.  Meloxicam daily for 10 days then as needed See me again in 3 weeks.

## 2013-07-21 NOTE — Assessment & Plan Note (Signed)
Patient does have what appears to be an ulnar nerve entrapment at is likely given her discomfort. Patient states it is mostly the worse at night. Patient is able to do activities daily living. Patient like to continue with conservative therapy. Patient was given a wrist brace, icing protocol, meloxicam daily for 10 days and was given home exercise program he should technique. Patient try these interventions and come back in 3 weeks for further evaluation. If she continues to have pain we may want to consider topical anti-inflammatories or steroid injection

## 2013-07-24 ENCOUNTER — Encounter: Payer: Self-pay | Admitting: Internal Medicine

## 2013-07-28 ENCOUNTER — Ambulatory Visit (INDEPENDENT_AMBULATORY_CARE_PROVIDER_SITE_OTHER): Payer: BC Managed Care – PPO | Admitting: Internal Medicine

## 2013-07-28 ENCOUNTER — Encounter: Payer: Self-pay | Admitting: Internal Medicine

## 2013-07-28 VITALS — BP 108/80 | HR 68 | Ht 64.5 in | Wt 220.4 lb

## 2013-07-28 DIAGNOSIS — K219 Gastro-esophageal reflux disease without esophagitis: Secondary | ICD-10-CM

## 2013-07-28 DIAGNOSIS — K921 Melena: Secondary | ICD-10-CM

## 2013-07-28 DIAGNOSIS — Z86010 Personal history of colonic polyps: Secondary | ICD-10-CM

## 2013-07-28 MED ORDER — MOVIPREP 100 G PO SOLR: ORAL | Status: DC

## 2013-07-28 NOTE — Progress Notes (Signed)
Patient ID: Kara Spears, female   DOB: 1960-11-29, 53 y.o.   MRN: [id number] HPI: Kara Spears is a 53 yo female with PMH of adenomatous colon polyps, GERD, hypothyroidism, sleep apnea, chronic headaches who is seen in consultation at the request of Dr. Gilford Rile for evaluation of hematochezia. She is here alone today. She reports over the last several months she has had intermittent rectal bleeding. This has been heavy at times, at one point so heavy she described it as a "as if I was on my period". 1 occasion she saw clots without stool. This was happening more frequently and with more brisk bleeding and April 2015. Of late it has been less of a problem. She denies ongoing constipation but occasionally she does have hard stool. She is having 1-3 bowel movements a day. In April she was having an episode of diarrhea with mucus in her stool and she wondered if she had an infection. This has resolved. She denies pain with defecation. No abdominal pain. No nausea or vomiting. She does have a history of GERD which she reports is well controlled on daily Dexilant. She denies dysphagia or odynophagia. She does use ibuprofen 3-4 tablets daily for joint pains and meloxicam has recently been prescribed but not started. She has lost 50 pounds, which is intentional. She was using phentermine, but has stopped and is now using a fitness pal, diet and exercise.  She is taking a probiotic  She has had 2 previous colonoscopies performed by Dr. Penelope Coop. She recalls the first in 2007 where 3 polyps were removed. The last in August 2012 where 2 polyps removed in the transverse colon found to be tubular adenomas. There is no report of diverticulosis or hemorrhoids on the colonoscopy.  She reports of colon polyps in her mother but no family history of colon cancer.  She is status post total abdominal hysterectomy at age 4.  Past Medical History  Diagnosis Date  . Hypothyroidism   . Ankle fracture, left   . Sleep apnea      not currently 07/28/13  . Acid reflux   . Recurrent sinusitis   . Chronic headaches   . Colon polyps     Past Surgical History  Procedure Laterality Date  . Abdominal hysterectomy    . Cesarean section      x 2   . Meniscus repair Right      knee    Current Outpatient Prescriptions  Medication Sig Dispense Refill  . DEXILANT 60 MG capsule TAKE ONE CAPSULE BY MOUTH EVERY DAY  30 capsule  5  . levothyroxine (SYNTHROID, LEVOTHROID) 75 MCG tablet TAKE 1 TABLET (75 MCG TOTAL) BY MOUTH DAILY.  30 tablet  5  . meloxicam (MOBIC) 15 MG tablet Take 1 tablet (15 mg total) by mouth daily.  30 tablet  0  . MOVIPREP 100 G SOLR Use per prep instruction  1 kit  0   No current facility-administered medications for this visit.    Allergies  Allergen Reactions  . Celexa [Citalopram Hydrobromide]   . Demerol [Meperidine]   . Meperidine Hcl     REACTION: gi upset    Family History  Problem Relation Age of Onset  . Hypertension Mother   . Aortic aneurysm Mother     AAA  . Hyperlipidemia Mother   . Breast cancer Maternal Aunt 33  . Multiple myeloma Maternal Grandmother   . Aneurysm Father   . Pancreatic cancer Maternal Uncle   . Colon polyps Mother  History  Substance Use Topics  . Smoking status: Never Smoker   . Smokeless tobacco: Never Used  . Alcohol Use: No    ROS: As per history of present illness, otherwise negative  BP 108/80  Pulse 68  Ht 5' 4.5" (1.638 m)  Wt 220 lb 6 oz (99.961 kg)  BMI 37.26 kg/m2 Constitutional: Well-developed and well-nourished. No distress. HEENT: Normocephalic and atraumatic. Oropharynx is clear and moist. No oropharyngeal exudate. Conjunctivae are normal.  No scleral icterus. Neck: Neck supple. Trachea midline. Cardiovascular: Normal rate, regular rhythm and intact distal pulses. No M/R/G Pulmonary/chest: Effort normal and breath sounds normal. No wheezing, rales or rhonchi. Abdominal: Soft, nontender, nondistended. Bowel sounds  active throughout.  Extremities: no clubbing, cyanosis, or edema Lymphadenopathy: No cervical adenopathy noted. Neurological: Alert and oriented to person place and time. Skin: Skin is warm and dry. No rashes noted. Psychiatric: Normal mood and affect. Behavior is normal.  RELEVANT LABS AND IMAGING: CBC    Component Value Date/Time   WBC 6.2 06/06/2013 0926   RBC 4.62 06/06/2013 0926   HGB 12.2 06/06/2013 0926   HCT 37.6 06/06/2013 0926   PLT 281.0 06/06/2013 0926   MCV 81.4 06/06/2013 0926   MCHC 32.5 06/06/2013 0926   RDW 15.2* 06/06/2013 0926   LYMPHSABS 1.7 06/06/2013 0926   MONOABS 0.4 06/06/2013 0926   EOSABS 0.2 06/06/2013 0926   BASOSABS 0.0 06/06/2013 0926    CMP     Component Value Date/Time   NA 140 06/03/2013 0813   K 4.1 06/03/2013 0813   CL 105 06/03/2013 0813   CO2 28 06/03/2013 0813   GLUCOSE 97 06/03/2013 0813   BUN 10 06/03/2013 0813   CREATININE 0.6 06/03/2013 0813   CALCIUM 9.3 06/03/2013 0813   PROT 6.9 06/03/2013 0813   ALBUMIN 3.7 06/03/2013 0813   AST 20 06/03/2013 0813   ALT 20 06/03/2013 0813   ALKPHOS 55 06/03/2013 0813   BILITOT 0.7 06/03/2013 0813   Iron/TIBC/Ferritin    Component Value Date/Time   IRON 62 11/21/2012 0834   FERRITIN 26.3 06/06/2013 0926     ASSESSMENT/PLAN:  53 yo female with PMH of adenomatous colon polyps, GERD, hypothyroidism, sleep apnea, chronic headaches who is seen in consultation at the request of Dr. Gilford Rile for evaluation of hematochezia.   1.  Hematochezia/history of colon polyp -- given her on and off bleeding which has persisted over the last several months, combined with her history of adenomatous colon polyps, I recommend repeating a colonoscopy at this time. This will allow for further evaluation and exclusion of additional polyps, inflammation, and internal hemorrhoids. If internal hemorrhoids are found we can discuss treatment, which may include banding. We discussed the test today including the risks and benefits and she is  agreeable to proceed. Hemoglobin was normal in April, MCV was 81.4. Ferritin was normal but borderline low at 26.  We discussed colonoscopy including the risks and benefits and she is agreeable to proceed.  2.  GERD -- well-controlled without alarm symptoms. She will continue Dexilant 60 mg daily

## 2013-07-28 NOTE — Patient Instructions (Signed)

## 2013-07-29 ENCOUNTER — Encounter: Payer: Self-pay | Admitting: Internal Medicine

## 2013-07-31 ENCOUNTER — Ambulatory Visit (AMBULATORY_SURGERY_CENTER): Payer: BC Managed Care – PPO | Admitting: Internal Medicine

## 2013-07-31 ENCOUNTER — Encounter: Payer: Self-pay | Admitting: Internal Medicine

## 2013-07-31 VITALS — BP 130/78 | HR 62 | Temp 97.8°F | Resp 16 | Ht 64.0 in | Wt 220.0 lb

## 2013-07-31 DIAGNOSIS — Z86010 Personal history of colonic polyps: Secondary | ICD-10-CM

## 2013-07-31 DIAGNOSIS — D126 Benign neoplasm of colon, unspecified: Secondary | ICD-10-CM

## 2013-07-31 DIAGNOSIS — K921 Melena: Secondary | ICD-10-CM

## 2013-07-31 HISTORY — PX: COLONOSCOPY W/ POLYPECTOMY: SHX1380

## 2013-07-31 MED ORDER — SODIUM CHLORIDE 0.9 % IV SOLN: 500.0000 mL | INTRAVENOUS | Status: DC

## 2013-07-31 NOTE — Patient Instructions (Signed)
YOU HAD AN ENDOSCOPIC PROCEDURE TODAY AT THE [facility]: Refer to the procedure report that was given to you for any specific questions about what was found during the examination.  If the procedure report does not answer your questions, please call your gastroenterologist to clarify.  If you requested that your care partner not be given the details of your procedure findings, then the procedure report has been included in a sealed envelope for you to review at your convenience later.  YOU SHOULD EXPECT: Some feelings of bloating in the abdomen. Passage of more gas than usual.  Walking can help get rid of the air that was put into your GI tract during the procedure and reduce the bloating. If you had a lower endoscopy (such as a colonoscopy or flexible sigmoidoscopy) you may notice spotting of blood in your stool or on the toilet paper. If you underwent a bowel prep for your procedure, then you may not have a normal bowel movement for a few days.  DIET: Your first meal following the procedure should be a light meal and then it is ok to progress to your normal diet.  A half-sandwich or bowl of soup is an example of a good first meal.  Heavy or fried foods are harder to digest and may make you feel nauseous or bloated.  Likewise meals heavy in dairy and vegetables can cause extra gas to form and this can also increase the bloating.  Drink plenty of fluids but you should avoid alcoholic beverages for 24 hours.  ACTIVITY: Your care partner should take you home directly after the procedure.  You should plan to take it easy, moving slowly for the rest of the day.  You can resume normal activity the day after the procedure however you should NOT DRIVE or use heavy machinery for 24 hours (because of the sedation medicines used during the test).    SYMPTOMS TO REPORT IMMEDIATELY: A gastroenterologist can be reached at any hour.  During normal business hours, 8:30 AM to 5:00 PM Monday through Friday,  call [phone].  After hours and on weekends, please call the GI answering service at [phone] who will take a message and have the physician on call contact you.   Following lower endoscopy (colonoscopy or flexible sigmoidoscopy):  Excessive amounts of blood in the stool  Significant tenderness or worsening of abdominal pains  Swelling of the abdomen that is new, acute  Fever of 100F or higher    FOLLOW UP: If any biopsies were taken you will be contacted by phone or by letter within the next 1-3 weeks.  Call your gastroenterologist if you have not heard about the biopsies in 3 weeks.  Our staff will call the home number listed on your records the next business day following your procedure to check on you and address any questions or concerns that you may have at that time regarding the information given to you following your procedure. This is a courtesy call and so if there is no answer at the home number and we have not heard from you through the emergency physician on call, we will assume that you have returned to your regular daily activities without incident.  SIGNATURES/CONFIDENTIALITY: You and/or your care partner have signed paperwork which will be entered into your electronic medical record.  These signatures attest to the fact that that the information above on your After Visit Summary has been reviewed and is understood.  Full responsibility of the confidentiality   of this discharge information lies with you and/or your care-partner.     

## 2013-07-31 NOTE — Progress Notes (Signed)
[place], [place] with prescription for Diltiazem gel.

## 2013-07-31 NOTE — Op Note (Signed)
[facility]  [employer], [address]   COLONOSCOPY PROCEDURE REPORT  PATIENT: Kara, Spears  MR#: [id number] BIRTHDATE: [birth year] , 53  yrs. old GENDER: Female ENDOSCOPIST: Jerene Bears, MD REFERRED HC:[id number] Gilford Rile, M.D. PROCEDURE DATE:  07/31/2013 PROCEDURE:   Colonoscopy with snare polypectomy First Screening Colonoscopy - Avg.  risk and is 50 yrs.  old or older - No.  Prior Negative Screening - Now for repeat screening. N/A  History of Adenoma - Now for follow-up colonoscopy & has been > or = to 3 yrs.  Yes hx of adenoma.  Has been 3 or more years since last colonoscopy.  Polyps Removed Today? Yes. ASA CLASS:   Class II INDICATIONS:Patient's personal history of colon polyps and hematochezia. MEDICATIONS: MAC sedation, administered by CRNA and propofol (Diprivan) 330mg  IV  DESCRIPTION OF PROCEDURE:   After the risks benefits and alternatives of the procedure were thoroughly explained, informed consent was obtained.  A digital rectal exam revealed a posterior anal fissure.   The LB [id number]  endoscope was introduced through the anus and advanced to the cecum, which was identified by both the appendix and ileocecal valve. No adverse events experienced.   The quality of the prep was good, using MoviPrep The instrument was then slowly withdrawn as the colon was fully examined.  COLON FINDINGS: A sessile polyp measuring 5 mm in size was found at the hepatic flexure.  A polypectomy was performed with a cold snare.  The resection was complete and the polyp tissue was completely retrieved.   Mild diverticulosis was noted in the descending colon and sigmoid colon.   The colon mucosa was otherwise normal.  Retroflexed views revealed no abnormalities. The time to cecum=2 minutes 29 seconds.  Withdrawal time=15 minutes 49 seconds.  The scope was withdrawn and the procedure completed. COMPLICATIONS: There were no complications.  ENDOSCOPIC  IMPRESSION: 1.   Sessile polyp measuring 5 mm in size was found at the hepatic flexure; polypectomy was performed with a cold snare 2.   Mild diverticulosis was noted in the descending colon and sigmoid colon 3.   The colon mucosa was otherwise normal 4.   Anal fissure, posterior anal canal  RECOMMENDATIONS: 1.  Await pathology results 2.  Diltiazem gel 2% twice daily for anal fissure 3.  Repeat Colonoscopy in 5 years. 4.  You will receive a letter within 1-2 weeks with the results of your biopsy as well as final recommendations.  Please call my office if you have not received a letter after 3 weeks.  eSigned:  Jerene Bears, MD 07/31/2013 8:30 AM     cc: The Patient and Ronette Deter MD

## 2013-07-31 NOTE — Progress Notes (Signed)
Called to room to assist during endoscopic procedure.  Patient ID and intended procedure confirmed with present staff. Received instructions for my participation in the procedure from the performing physician.  

## 2013-08-01 ENCOUNTER — Encounter: Payer: Self-pay | Admitting: *Deleted

## 2013-08-01 ENCOUNTER — Encounter: Payer: Self-pay | Admitting: Internal Medicine

## 2013-08-01 LAB — HM MAMMOGRAPHY: HM Mammogram: NEGATIVE

## 2013-08-04 ENCOUNTER — Telehealth: Payer: Self-pay

## 2013-08-04 NOTE — Telephone Encounter (Signed)
  Follow up Call-  Call back number 07/31/2013  Post procedure Call Back phone  # [phone]  Permission to leave phone message Yes     Patient questions:  Do you have a fever, pain , or abdominal swelling? no Pain Score  0 *  Have you tolerated food without any problems? yes  Have you been able to return to your normal activities? yes  Do you have any questions about your discharge instructions: Diet   no Medications  no Follow up visit  no  Do you have questions or concerns about your Care? no  Actions: * If pain score is 4 or above: No action needed, pain <4.

## 2013-08-06 ENCOUNTER — Encounter: Payer: Self-pay | Admitting: Internal Medicine

## 2013-08-11 ENCOUNTER — Ambulatory Visit: Payer: BC Managed Care – PPO | Admitting: Family Medicine

## 2013-08-28 ENCOUNTER — Ambulatory Visit (INDEPENDENT_AMBULATORY_CARE_PROVIDER_SITE_OTHER): Payer: BC Managed Care – PPO | Admitting: Family Medicine

## 2013-08-28 ENCOUNTER — Encounter: Payer: Self-pay | Admitting: Family Medicine

## 2013-08-28 VITALS — BP 120/78 | HR 71 | Temp 97.8°F | Ht 64.0 in | Wt 222.0 lb

## 2013-08-28 DIAGNOSIS — G5622 Lesion of ulnar nerve, left upper limb: Secondary | ICD-10-CM

## 2013-08-28 DIAGNOSIS — G5620 Lesion of ulnar nerve, unspecified upper limb: Secondary | ICD-10-CM

## 2013-08-28 NOTE — Progress Notes (Signed)
  [facility] [place],  [address] Phone: [phone] Subjective:      CC: Right hand pain followup  Kara Spears SOBIA KARGER is a 53 y.o. female coming in with complaint of right hand pain followup. 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 Patient was given a brace that she did wear day and night for 2 weeks and then nightly thereafter. We discussed icing protocol as well as topical anti-inflammatories. Patient states that she is approximately 70-80% better. Patient states that her hand is not locking up as much in the numbness in the fingers has improved as well. Patient states that she's been able to do all activities of daily living without any significant discomfort. Patient is fairly happy with the results.     Past medical history, social, surgical and family history all reviewed in electronic medical record.   Review of Systems: No headache, visual changes, nausea, vomiting, diarrhea, constipation, dizziness, abdominal pain, skin rash, fevers, chills, night sweats, weight loss, swollen lymph nodes, body aches, joint swelling, muscle aches, chest pain, shortness of breath, mood changes.   Objective Blood pressure 120/78, pulse 71, temperature 97.8 F (36.6 C), temperature source Oral, height 5\' 4"  (1.626 m), weight 222 lb (100.699 kg), SpO2 95.00%.  General: No apparent distress alert and oriented x3 mood and affect normal, dressed appropriately.  HEENT: Pupils equal, extraocular movements intact  Respiratory: Patient's speak in full sentences and does not appear short of breath  Cardiovascular: No lower extremity edema, non tender, no erythema  Skin: Warm dry intact with no signs of infection or rash on extremities or on axial skeleton.  Abdomen: Soft nontender  Neuro: Cranial nerves II through XII are intact, neurovascularly intact in all extremities with 2+ DTRs and 2+ pulses.  Lymph: No lymphadenopathy of posterior  or anterior cervical chain or axillae bilaterally.  Gait normal with good balance and coordination.  MSK:  Non tender with full range of motion and good stability and symmetric strength and tone of shoulders, elbows,  hip, knee and ankles bilaterally.  Wrist: Right Inspection normal with no visible erythema or swelling. ROM smooth and normal with good flexion and extension and ulnar/radial deviation that is symmetrical with opposite wrist. Palpation is normal over metacarpals, navicular, lunate, and the no pain over the TFCC today.  No snuffbox tenderness. Positive tenderness over Canal of Guyon. Strength 5/5 in all directions without pain. Negative Finkelstein, tinel's and phalens. Negative Watson's test. Contralateral wrist unremarkable      Impression and Recommendations:     This case required medical decision making of moderate complexity.

## 2013-08-28 NOTE — Progress Notes (Signed)
Pre visit review using our clinic review tool, if applicable. No additional management support is needed unless otherwise documented below in the visit note. 

## 2013-08-28 NOTE — Assessment & Plan Note (Signed)
Patient is doing very well with conservative therapy at this point. Continue exercises 3 times a week.  Icing and brace as needed.  Come back again 3 weeks for follow up.

## 2013-08-28 NOTE — Patient Instructions (Signed)
Good to see you. You are doing great!!!! Continue the exercises 3 times a week.  Ice is your friend Wear the brace as needed.  See me in 3 weeks if not completely healed.

## 2013-09-18 ENCOUNTER — Ambulatory Visit (INDEPENDENT_AMBULATORY_CARE_PROVIDER_SITE_OTHER): Payer: BC Managed Care – PPO | Admitting: Family Medicine

## 2013-09-18 ENCOUNTER — Other Ambulatory Visit (INDEPENDENT_AMBULATORY_CARE_PROVIDER_SITE_OTHER): Payer: BC Managed Care – PPO

## 2013-09-18 ENCOUNTER — Encounter: Payer: Self-pay | Admitting: Family Medicine

## 2013-09-18 VITALS — BP 114/80 | HR 69 | Ht 64.0 in | Wt 224.0 lb

## 2013-09-18 DIAGNOSIS — M25531 Pain in right wrist: Secondary | ICD-10-CM

## 2013-09-18 DIAGNOSIS — G5621 Lesion of ulnar nerve, right upper limb: Secondary | ICD-10-CM

## 2013-09-18 DIAGNOSIS — S63096A Other dislocation of unspecified wrist and hand, initial encounter: Secondary | ICD-10-CM

## 2013-09-18 DIAGNOSIS — M25539 Pain in unspecified wrist: Secondary | ICD-10-CM

## 2013-09-18 DIAGNOSIS — Z4789 Encounter for other orthopedic aftercare: Secondary | ICD-10-CM

## 2013-09-18 DIAGNOSIS — G5620 Lesion of ulnar nerve, unspecified upper limb: Secondary | ICD-10-CM

## 2013-09-18 DIAGNOSIS — IMO0001 Reserved for inherently not codable concepts without codable children: Secondary | ICD-10-CM

## 2013-09-18 DIAGNOSIS — IMO0002 Reserved for concepts with insufficient information to code with codable children: Secondary | ICD-10-CM | POA: Insufficient documentation

## 2013-09-18 NOTE — Progress Notes (Signed)
[facility] [place], [place] [address] Phone: [phone] Subjective:      CC: Right hand pain followup  EQA:[id number] Kara Spears is a 53 y.o. female coming in with complaint of right hand pain followup. 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 Patient was doing significantly well with conservative therapy and was 70-80% better after last visit.. Patient states that unfortunately since then she has started having some mild increasing in pain again. Patient states that the pain is always accompanied with numbness once again in the ulnar distribution. Seems to be always of the wrist on down no radiation to the forearm or shoulder or neck. It states though that this seems to be more unilateral at this time. Only on the right side.     Past medical history, social, surgical and family history all reviewed in electronic medical record.   Review of Systems: No headache, visual changes, nausea, vomiting, diarrhea, constipation, dizziness, abdominal pain, skin rash, fevers, chills, night sweats, weight loss, swollen lymph nodes, body aches, joint swelling, muscle aches, chest pain, shortness of breath, mood changes.   Objective Blood pressure 114/80, pulse 69, height 5\' 4"  (1.626 m), weight 224 lb (101.606 kg), SpO2 97.00%.  General: No apparent distress alert and oriented x3 mood and affect normal, dressed appropriately.  HEENT: Pupils equal, extraocular movements intact  Respiratory: Patient's speak in full sentences and does not appear short of breath  Cardiovascular: No lower extremity edema, non tender, no erythema  Skin: Warm dry intact with no signs of infection or rash on extremities or on axial skeleton.  Abdomen: Soft nontender  Neuro: Cranial nerves II through XII are intact, neurovascularly intact in all extremities with 2+ DTRs and 2+ pulses.  Lymph: No lymphadenopathy of posterior or anterior cervical chain or axillae  bilaterally.  Gait normal with good balance and coordination.  MSK:  Non tender with full range of motion and good stability and symmetric strength and tone of shoulders, elbows,  hip, knee and ankles bilaterally.  Wrist: Right Inspection normal with no visible erythema or swelling. ROM smooth and normal with good flexion and extension and ulnar/radial deviation that is symmetrical with opposite wrist. Palpation is normal over metacarpals, navicular, lunate, and the no pain over the TFCC today. Patient though does have pain over the extensor carpi ulnaris No snuffbox tenderness. Positive tenderness over Canal of Guyon. Strength 5/5 in all directions without pain. Negative Finkelstein, tinel's and phalens. Negative Watson's test. Contralateral wrist unremarkable  MSK US performed of: Right wrist This study was ordered, performed, and interpreted by Charlann Boxer D.O.  Wrist: All extensor compartments visualized and and compartment 5 patient does have swelling within the extensor carpi ulnaris tendon sheath. This does appear to be giving some compression over the ulnar nerve TFCC intact. Scapholunate ligament intact. Carpal tunnel visualized and median nerve area normal, flexor tendons all normal in appearance without fraying, tears, or sheath effusions. Power doppler signal normal.  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 Patient tolerated the procedure well with near complete resolution of pain. No blood loss. Band-Aid placed. Post injection instructions given.     Impression and Recommendations:     This case required medical  decision making of moderate complexity.

## 2013-09-18 NOTE — Patient Instructions (Signed)
Good to see you We tried an injection in the tendon today.  Continue ice and exercises Lets try physical therapy Continue bracing at night if it helps Write me in 1 week, if doing well then come back in 4-6 weeks, if not great we will get a EMG

## 2013-09-18 NOTE — Assessment & Plan Note (Signed)
I believe the patient's tenosynovitis was seen on ultrasound today an MRI previously is getting compression on the ulnar nerve giving her the neuropathy. Patient was given an injection today within the tendon sheath and tolerated the procedure extremely well. We discussed continuing the icing and as well as the bracing. Patient will also be referred to formal physical therapy. Patient will try these interventions and give me a message in one week. The patient continues to have trouble I would consider an EMG. Patient also may be a candidate for nitroglycerin as well as topical anti-inflammatories in the future. We'll discuss at followup in 4-6 weeks as well and.  Spent greater than 25 minutes with patient face-to-face and had greater than 50% of counseling including as described above in assessment and plan.

## 2013-09-29 ENCOUNTER — Other Ambulatory Visit: Payer: Self-pay | Admitting: Internal Medicine

## 2013-10-11 ENCOUNTER — Emergency Department: Payer: Self-pay | Admitting: Emergency Medicine

## 2013-11-12 ENCOUNTER — Other Ambulatory Visit: Payer: Self-pay | Admitting: Internal Medicine

## 2013-11-28 ENCOUNTER — Other Ambulatory Visit: Payer: Self-pay

## 2014-11-25 ENCOUNTER — Other Ambulatory Visit: Payer: Self-pay | Admitting: Internal Medicine

## 2014-11-25 NOTE — Telephone Encounter (Signed)
Last OV was 06/05/13? Please advise refill?

## 2014-12-29 ENCOUNTER — Other Ambulatory Visit (INDEPENDENT_AMBULATORY_CARE_PROVIDER_SITE_OTHER): Payer: No Typology Code available for payment source

## 2014-12-29 DIAGNOSIS — E039 Hypothyroidism, unspecified: Secondary | ICD-10-CM | POA: Diagnosis not present

## 2014-12-30 ENCOUNTER — Encounter: Payer: Self-pay | Admitting: Internal Medicine

## 2014-12-30 ENCOUNTER — Other Ambulatory Visit: Payer: Self-pay | Admitting: *Deleted

## 2014-12-30 LAB — TSH: TSH: 6.15 u[IU]/mL — AB (ref 0.35–4.50)

## 2014-12-30 MED ORDER — LEVOTHYROXINE SODIUM 75 MCG PO TABS: ORAL_TABLET | ORAL | Status: DC

## 2015-01-20 ENCOUNTER — Encounter: Payer: Self-pay | Admitting: Internal Medicine

## 2015-01-20 ENCOUNTER — Ambulatory Visit (INDEPENDENT_AMBULATORY_CARE_PROVIDER_SITE_OTHER): Payer: No Typology Code available for payment source | Admitting: Internal Medicine

## 2015-01-20 VITALS — BP 136/80 | HR 68 | Temp 98.2°F | Ht 64.0 in | Wt 238.1 lb

## 2015-01-20 DIAGNOSIS — Z8249 Family history of ischemic heart disease and other diseases of the circulatory system: Secondary | ICD-10-CM | POA: Insufficient documentation

## 2015-01-20 DIAGNOSIS — Z23 Encounter for immunization: Secondary | ICD-10-CM | POA: Diagnosis not present

## 2015-01-20 DIAGNOSIS — E039 Hypothyroidism, unspecified: Secondary | ICD-10-CM | POA: Diagnosis not present

## 2015-01-20 DIAGNOSIS — M25552 Pain in left hip: Secondary | ICD-10-CM

## 2015-01-20 DIAGNOSIS — R079 Chest pain, unspecified: Secondary | ICD-10-CM | POA: Insufficient documentation

## 2015-01-20 DIAGNOSIS — M79671 Pain in right foot: Secondary | ICD-10-CM | POA: Insufficient documentation

## 2015-01-20 DIAGNOSIS — Z8679 Personal history of other diseases of the circulatory system: Secondary | ICD-10-CM

## 2015-01-20 DIAGNOSIS — M79672 Pain in left foot: Secondary | ICD-10-CM

## 2015-01-20 DIAGNOSIS — F4323 Adjustment disorder with mixed anxiety and depressed mood: Secondary | ICD-10-CM

## 2015-01-20 LAB — CBC WITH DIFFERENTIAL/PLATELET
BASOS PCT: 0.7 % (ref 0.0–3.0)
Basophils Absolute: 0.1 10*3/uL (ref 0.0–0.1)
EOS PCT: 3.3 % (ref 0.0–5.0)
Eosinophils Absolute: 0.3 10*3/uL (ref 0.0–0.7)
HEMATOCRIT: 38.3 % (ref 36.0–46.0)
HEMOGLOBIN: 12.2 g/dL (ref 12.0–15.0)
LYMPHS PCT: 25.8 % (ref 12.0–46.0)
Lymphs Abs: 2 10*3/uL (ref 0.7–4.0)
MCHC: 31.9 g/dL (ref 30.0–36.0)
MCV: 80.6 fl (ref 78.0–100.0)
MONO ABS: 0.6 10*3/uL (ref 0.1–1.0)
MONOS PCT: 7.6 % (ref 3.0–12.0)
Neutro Abs: 4.8 10*3/uL (ref 1.4–7.7)
Neutrophils Relative %: 62.6 % (ref 43.0–77.0)
Platelets: 299 10*3/uL (ref 150.0–400.0)
RBC: 4.75 Mil/uL (ref 3.87–5.11)
RDW: 15.5 % (ref 11.5–15.5)
WBC: 7.7 10*3/uL (ref 4.0–10.5)

## 2015-01-20 LAB — VITAMIN B12: Vitamin B-12: 322 pg/mL (ref 211–911)

## 2015-01-20 LAB — COMPREHENSIVE METABOLIC PANEL
ALT: 26 U/L (ref 0–35)
AST: 22 U/L (ref 0–37)
Albumin: 4.1 g/dL (ref 3.5–5.2)
Alkaline Phosphatase: 60 U/L (ref 39–117)
BILIRUBIN TOTAL: 0.3 mg/dL (ref 0.2–1.2)
BUN: 12 mg/dL (ref 6–23)
CALCIUM: 9.1 mg/dL (ref 8.4–10.5)
CO2: 27 mEq/L (ref 19–32)
CREATININE: 0.66 mg/dL (ref 0.40–1.20)
Chloride: 104 mEq/L (ref 96–112)
GFR: 99.62 mL/min (ref >60.00)
GLUCOSE: 104 mg/dL — AB (ref 70–99)
Potassium: 3.8 mEq/L (ref 3.5–5.1)
Sodium: 140 mEq/L (ref 135–145)
Total Protein: 7 g/dL (ref 6.0–8.3)

## 2015-01-20 LAB — VITAMIN D 25 HYDROXY (VIT D DEFICIENCY, FRACTURES): VITD: 10.65 ng/mL — AB (ref 30.00–100.00)

## 2015-01-20 LAB — HEMOGLOBIN A1C: HEMOGLOBIN A1C: 6 % (ref 4.6–6.5)

## 2015-01-20 MED ORDER — ERGOCALCIFEROL 1.25 MG (50000 UT) PO CAPS: 50000.0000 [IU] | ORAL_CAPSULE | ORAL | Status: DC

## 2015-01-20 MED ORDER — LEVOTHYROXINE SODIUM 75 MCG PO TABS: ORAL_TABLET | ORAL | Status: DC

## 2015-01-20 MED ORDER — DEXLANSOPRAZOLE 60 MG PO CPDR: 1.0000 | DELAYED_RELEASE_CAPSULE | Freq: Every day | ORAL | Status: DC

## 2015-01-20 MED ORDER — DULOXETINE HCL 20 MG PO CPEP: 20.0000 mg | ORAL_CAPSULE | Freq: Every day | ORAL | Status: DC

## 2015-01-20 NOTE — Assessment & Plan Note (Signed)
Father with AAA. Will set up vascular evaluation. Question what modality would be best for screening AAA. Given obesity, suspect [place] eval limited use.

## 2015-01-20 NOTE — Assessment & Plan Note (Signed)
Foot pain most consistent with plantar fasciitis. No improvement with conservative measures, NSAIDS. Will set up podiatry evaluation.

## 2015-01-20 NOTE — Addendum Note (Signed)
Addended by: Ronette Deter A on: 01/20/2015 05:06 PM   Modules accepted: Orders

## 2015-01-20 NOTE — Assessment & Plan Note (Signed)
Left hip pain which occurs mostly with pressure on hip with sleeping. No current pain. Suspect changes in gait from plantar fasciitis and weight gain have contributed. Will start Cymbalta to help with pain. Follow up 2 weeks.

## 2015-01-20 NOTE — Assessment & Plan Note (Signed)
Worsening anxiety and depression with recent stressors. Offered support today. Will set up counseling. Start Cymbalta. Follow up in 2 weeks and prn.

## 2015-01-20 NOTE — Progress Notes (Signed)
Subjective:    Patient ID: Kara Spears, female    DOB: February 15, 1960, 54 y.o.   MRN: [id number]  HPI  54YO female presents for follow up.  Very difficult time for her. Her mother was diagnosed with lung cancer and passed away earlier this year. Her father has AAA and has been ill. Both daughters have been pregnant. She is stressed about work.  Foot pain - Sharp severe pain in heels. Occurs when first walking. Started several months ago after caring for her mother. Has tried changing shoes. Tried Ibuprofen, all with no improvement.  Left hip pain - Started several months ago. Aching pain. Does not radiate. Worse at night. Prevents her from sleeping.Taking up to 4 Ibuprofen per day with minimal improvement. No falls or injuries.  Left chest pain - Occurred at daily night several months ago, just after her mother died. More intermittent for about one month. Described as pressure. Does not radiate. No dyspnea. Lasts for several minutes and resolves without intervention.   Depression - Symptoms of depressed mood much worse recently. Tearful today describing things. Willing to consider counseling and medication. Not suicidal.  HT - ran out of thyroid medication. Recent TSH was elevated at 6.  Wt Readings from Last 3 Encounters:  01/20/15 238 lb 2 oz (108.013 kg)  09/18/13 224 lb (101.606 kg)  08/28/13 222 lb (100.699 kg)   BP Readings from Last 3 Encounters:  01/20/15 136/80  09/18/13 114/80  08/28/13 120/78    Past Medical History  Diagnosis Date  . Hypothyroidism   . Ankle fracture, left   . Sleep apnea     not currently 07/28/13  . Acid reflux   . Recurrent sinusitis   . Chronic headaches   . Colon polyps    Family History  Problem Relation Age of Onset  . Hypertension Mother   . Aortic aneurysm Mother     AAA  . Hyperlipidemia Mother   . Colon polyps Mother   . Cancer Mother     NSCLC  . Breast cancer Maternal Aunt 51  . Multiple myeloma Maternal Grandmother   .  Aneurysm Father   . Pancreatic cancer Maternal Uncle    Past Surgical History  Procedure Laterality Date  . Abdominal hysterectomy    . Cesarean section      x 2   . Meniscus repair Right      knee   Social History   Social History  . Marital Status: Married    Spouse Name: N/A  . Number of Children: 2  . Years of Education: N/A   Occupational History  . Daycare    .     Social History Main Topics  . Smoking status: Never Smoker   . Smokeless tobacco: Never Used  . Alcohol Use: No  . Drug Use: No  . Sexual Activity: Not Asked   Other Topics Concern  . None   Social History Narrative   Lives in [place]     Review of Systems  Constitutional: Positive for fatigue. Negative for fever, chills, appetite change and unexpected weight change.  Eyes: Negative for visual disturbance.  Respiratory: Negative for cough, chest tightness and shortness of breath.   Cardiovascular: Positive for chest pain. Negative for palpitations and leg swelling.  Gastrointestinal: Negative for nausea, vomiting, abdominal pain, diarrhea and constipation.  Musculoskeletal: Positive for myalgias and arthralgias.  Skin: Negative for color change and rash.  Neurological: Negative for weakness and numbness.  Hematological: Negative for  adenopathy. Does not bruise/bleed easily.  Psychiatric/Behavioral: Positive for sleep disturbance and dysphoric mood. The patient is nervous/anxious.        Objective:    BP 136/80 mmHg  Pulse 68  Temp(Src) 98.2 F (36.8 C) (Oral)  Ht $R'5\' 4"'Rh$  (1.626 m)  Wt 238 lb 2 oz (108.013 kg)  BMI 40.85 kg/m2  SpO2 100% Physical Exam  Constitutional: She is oriented to person, place, and time. She appears well-developed and well-nourished. No distress.  HENT:  Head: Normocephalic and atraumatic.  Right Ear: External ear normal.  Left Ear: External ear normal.  Nose: Nose normal.  Mouth/Throat: Oropharynx is clear and moist. No oropharyngeal exudate.  Eyes:  Conjunctivae are normal. Pupils are equal, round, and reactive to light. Right eye exhibits no discharge. Left eye exhibits no discharge. No scleral icterus.  Neck: Normal range of motion. Neck supple. No tracheal deviation present. No thyromegaly present.  Cardiovascular: Normal rate, regular rhythm, normal heart sounds and intact distal pulses.  Exam reveals no gallop and no friction rub.   No murmur heard. Pulmonary/Chest: Effort normal and breath sounds normal. No respiratory distress. She has no wheezes. She has no rales. She exhibits no tenderness.  Musculoskeletal: Normal range of motion. She exhibits no edema or tenderness.  Lymphadenopathy:    She has no cervical adenopathy.  Neurological: She is alert and oriented to person, place, and time. No cranial nerve deficit. She exhibits normal muscle tone. Coordination normal.  Skin: Skin is warm and dry. No rash noted. She is not diaphoretic. No erythema. No pallor.  Psychiatric: Her speech is normal and behavior is normal. Judgment and thought content normal. Her mood appears anxious. Cognition and memory are normal. She exhibits a depressed mood. She expresses no suicidal ideation.          Assessment & Plan:  Over 55min of which >50% spent in face-to-face contact with patient discussing plan of care for left chest pain, family history of AAA, depression and arthralgia.  Problem List Items Addressed This Visit      Unprioritized   Adjustment disorder with mixed anxiety and depressed mood    Worsening anxiety and depression with recent stressors. Offered support today. Will set up counseling. Start Cymbalta. Follow up in 2 weeks and prn.      Relevant Medications   DULoxetine (CYMBALTA) 20 MG capsule   Other Relevant Orders   Ambulatory referral to Psychology   Bilateral foot pain    Foot pain most consistent with plantar fasciitis. No improvement with conservative measures, NSAIDS. Will set up podiatry evaluation.        Relevant Orders   Comprehensive metabolic panel   Hemoglobin A1c   CBC with Differential/Platelet   VITAMIN D 25 Hydroxy (Vit-D Deficiency, Fractures)   B12   Ambulatory referral to Podiatry   Family history of abdominal aortic aneurysm    Father with AAA. Will set up vascular evaluation. Question what modality would be best for screening AAA. Given obesity, suspect [place] eval limited use.      Hypothyroidism - Primary    Recent TSH elevated, however out of medication. Will recheck TSH in 6 weeks.      Relevant Medications   levothyroxine (SYNTHROID, LEVOTHROID) 75 MCG tablet   Left hip pain    Left hip pain which occurs mostly with pressure on hip with sleeping. No current pain. Suspect changes in gait from plantar fasciitis and weight gain have contributed. Will start Cymbalta to help with pain. Follow  up 2 weeks.      Left sided chest pain    Left sided chest pain concerning for CAD. Strong family history of CAD and risk factors including metabolic syndrome. EKG abnormal with negative t-waves. Will set up cardiology evaluation.      Relevant Orders   EKG 12-Lead (Completed)   Ambulatory referral to Cardiology       Return in about 2 weeks (around 02/03/2015) for Recheck.

## 2015-01-20 NOTE — Patient Instructions (Signed)
Labs today.  We will set up referrals to podiatry, psychology and vascular surgery.  Start Cymbalta 20mg  daily.  Follow up in 2 weeks.

## 2015-01-20 NOTE — Progress Notes (Signed)
Pre visit review using our clinic review tool, if applicable. No additional management support is needed unless otherwise documented below in the visit note. 

## 2015-01-20 NOTE — Assessment & Plan Note (Signed)
Left sided chest pain concerning for CAD. Strong family history of CAD and risk factors including metabolic syndrome. EKG abnormal with negative t-waves. Will set up cardiology evaluation.

## 2015-01-20 NOTE — Assessment & Plan Note (Signed)
Recent TSH elevated, however out of medication. Will recheck TSH in 6 weeks.

## 2015-01-21 ENCOUNTER — Ambulatory Visit (INDEPENDENT_AMBULATORY_CARE_PROVIDER_SITE_OTHER): Payer: No Typology Code available for payment source | Admitting: Cardiovascular Disease

## 2015-01-21 ENCOUNTER — Encounter: Payer: Self-pay | Admitting: Cardiovascular Disease

## 2015-01-21 VITALS — BP 138/90 | HR 74 | Ht 64.0 in | Wt 236.0 lb

## 2015-01-21 DIAGNOSIS — E785 Hyperlipidemia, unspecified: Secondary | ICD-10-CM

## 2015-01-21 DIAGNOSIS — R0602 Shortness of breath: Secondary | ICD-10-CM | POA: Diagnosis not present

## 2015-01-21 DIAGNOSIS — R079 Chest pain, unspecified: Secondary | ICD-10-CM | POA: Diagnosis not present

## 2015-01-21 DIAGNOSIS — E669 Obesity, unspecified: Secondary | ICD-10-CM | POA: Diagnosis not present

## 2015-01-21 DIAGNOSIS — F4323 Adjustment disorder with mixed anxiety and depressed mood: Secondary | ICD-10-CM

## 2015-01-21 NOTE — Assessment & Plan Note (Signed)
Atypical symptoms on the left EKG on today's visit looks essentially normal, previously noted T-wave abnormality has resolved. Unclear if this was secondary to lead placement. Certainly has significant stress which could explain her symptoms. Only has high cholesterol, no smoking history, no diabetes. Unable to treadmill secondary to back and foot issues, as well as knee problems. She was willing to try a bit reports that she would have significant problems. In general has been icing her joints at the end of the day. Myoview also a poor choice given the high likelihood of breast attenuation artifact. Best option would be watchful waiting given low risk of coronary disease versus CT coronary calcium score. She has indicated an interest in the CT coronary calcium score. She will monitor her symptoms for now, discuss this with Dr. Gilford Rile. If she would like to do this, we will try to find a time that is convenient for her given her busy work schedule managing a daycare, perhaps when the daycare closes for the holiday break this could be arranged

## 2015-01-21 NOTE — Assessment & Plan Note (Signed)
Still grieving after the loss of her mother Unfortunately does not have much leeway to take time off from work

## 2015-01-21 NOTE — Progress Notes (Signed)
Patient ID: Kara Spears, female    DOB: 22-Jun-1960, 54 y.o.   MRN: [id number]  HPI Comments: Ms Laningham is a very pleasant 54 year old woman with a history of hyperlipidemia, family history of coronary artery disease, hypothyroidism, obesity, GERD, presenting for evaluation of chest discomfort and abnormal EKG.   Recently seen by Dr. Gilford Rile, found to have T-wave abnormality V1 through V3 She does report having occasional episodes of pinching or burning in her chest, not typically associated with exertion.  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 Recent loss of her mother who used to work with her, has added to her stress at work and at home.  Mother was diagnosed with lung cancer and had a very short course of treatment before passing. Father with AAA, status post repair, MI,coronary artery disease, stenting Both parents have long history of smoking  EKG on today's visit shows normal sinus rhythm with rate 74 bpm,No significant ST or T-wave changes  Lab work reviewed with her showing total cholesterol 220s Previously when she was on phentermine, weight was lower by 30 pounds, cholesterol was 207     Allergies  Allergen Reactions  . Celexa [Citalopram Hydrobromide]   . Demerol [Meperidine]   . Meperidine Hcl     REACTION: gi upset    Current Outpatient Prescriptions on File Prior to Visit  Medication Sig Dispense Refill  . dexlansoprazole (DEXILANT) 60 MG capsule Take 1 capsule (60 mg total) by mouth daily. 90 capsule 3  . levothyroxine (SYNTHROID, LEVOTHROID) 75 MCG tablet TAKE 1 TABLET (75 MCG TOTAL) BY MOUTH DAILY. 90 tablet 3  . DULoxetine (CYMBALTA) 20 MG capsule Take 1 capsule (20 mg total) by mouth  daily. (Patient not taking: Reported on 01/21/2015) 30 capsule 3  . ergocalciferol (VITAMIN D2) 50000 UNITS capsule Take 1 capsule (50,000 Units total) by mouth once a week. (Patient not taking: Reported on 01/21/2015) 12 capsule 3   No current facility-administered medications on file prior to visit.    Past Medical History  Diagnosis Date  . Hypothyroidism   . Ankle fracture, left   . Sleep apnea     not currently 07/28/13  . Acid reflux   . Recurrent sinusitis   . Chronic headaches   . Colon polyps     Past Surgical History  Procedure Laterality Date  . Abdominal hysterectomy    . Cesarean section      x 2   . Meniscus repair Right      knee    Social History  reports that she has never smoked. She has never used smokeless tobacco. She reports that she does not drink alcohol or use illicit drugs.  Family History family history includes Aneurysm in her father; Aortic aneurysm in her mother; Breast cancer (age of onset: 78) in her maternal aunt; Cancer in her mother; Colon polyps in her mother; Heart attack in her father; Hyperlipidemia in her mother; Hypertension in her mother; Multiple myeloma in her maternal grandmother; Pancreatic cancer in her maternal uncle.    Review of Systems  Constitutional: Negative.   Respiratory: Negative.   Cardiovascular:       Chest symptoms on the left described as a  quick stabbing or burning associated at rest, not with exertion  Gastrointestinal: Negative.   Musculoskeletal: Negative.   Neurological: Negative.   Hematological: Negative.   Psychiatric/Behavioral: The patient is nervous/anxious.   All other systems reviewed and are negative.   BP 138/90 mmHg  Pulse 74  Ht $R'5\' 4"'az$  (1.626 m)  Wt 236 lb (107.049 kg)  BMI 40.49 kg/m2  Physical Exam  Constitutional: She is oriented to person, place, and time. She appears well-developed and well-nourished.  obese  HENT:  Head: Normocephalic.  Nose: Nose normal.  Mouth/Throat:  Oropharynx is clear and moist.  Eyes: Conjunctivae are normal. Pupils are equal, round, and reactive to light.  Neck: Normal range of motion. Neck supple. No JVD present.  Cardiovascular: Normal rate, regular rhythm, normal heart sounds and intact distal pulses.  Exam reveals no gallop and no friction rub.   No murmur heard. Pulmonary/Chest: Effort normal and breath sounds normal. No respiratory distress. She has no wheezes. She has no rales. She exhibits no tenderness.  Abdominal: Soft. Bowel sounds are normal. She exhibits no distension. There is no tenderness.  Musculoskeletal: Normal range of motion. She exhibits no edema or tenderness.  Lymphadenopathy:    She has no cervical adenopathy.  Neurological: She is alert and oriented to person, place, and time. Coordination normal.  Skin: Skin is warm and dry. No rash noted. No erythema.  Psychiatric: She has a normal mood and affect. Her behavior is normal. Judgment and thought content normal.

## 2015-01-21 NOTE — Patient Instructions (Signed)
You are doing well. No medication changes were made.  Please research CT coronary calcium score on google images  Please call us if you have new issues that need to be addressed before your next appt.

## 2015-01-21 NOTE — Assessment & Plan Note (Signed)
We have encouraged continued exercise, careful diet management in an effort to lose weight. She does report taking phentermine several years ago which helped her lose 70 pounds, she has gained 30 pounds back

## 2015-01-21 NOTE — Assessment & Plan Note (Signed)
Total cholesterol in the 220 range. She's not particularly eager to start a cholesterol medication. If interested, CT coronary calcium score could be performed for risk stratification and to determine whether a statin is needed. If score was very low, would treat with diet and exercise

## 2015-02-05 ENCOUNTER — Ambulatory Visit: Payer: No Typology Code available for payment source | Admitting: Internal Medicine

## 2015-03-18 ENCOUNTER — Ambulatory Visit: Payer: No Typology Code available for payment source | Admitting: Cardiovascular Disease

## 2015-04-08 ENCOUNTER — Encounter: Payer: Self-pay | Admitting: Internal Medicine

## 2015-04-22 ENCOUNTER — Ambulatory Visit (INDEPENDENT_AMBULATORY_CARE_PROVIDER_SITE_OTHER): Payer: No Typology Code available for payment source | Admitting: Podiatry

## 2015-04-22 NOTE — Addendum Note (Signed)
Addended by: Cranford Mon R on: 04/22/2015 11:58 AM   Modules accepted: Orders

## 2015-04-22 NOTE — Progress Notes (Signed)
Patient ID: Kara Spears, female   DOB: Jul 07, 1960, 55 y.o.   MRN: [id number]  No show for appointment.

## 2015-08-20 ENCOUNTER — Other Ambulatory Visit: Payer: Self-pay

## 2016-01-21 ENCOUNTER — Encounter: Payer: Self-pay | Admitting: Emergency Medicine

## 2016-01-21 ENCOUNTER — Emergency Department: Payer: No Typology Code available for payment source

## 2016-01-21 ENCOUNTER — Emergency Department
Admission: EM | Admit: 2016-01-21 | Discharge: 2016-01-21 | Disposition: A | Discharge status: Home / Self Care | Payer: No Typology Code available for payment source | Attending: Emergency Medicine | Admitting: Emergency Medicine

## 2016-01-21 DIAGNOSIS — S8011XA Contusion of right lower leg, initial encounter: Secondary | ICD-10-CM | POA: Diagnosis not present

## 2016-01-21 DIAGNOSIS — S2001XA Contusion of right breast, initial encounter: Secondary | ICD-10-CM

## 2016-01-21 DIAGNOSIS — Z79899 Other long term (current) drug therapy: Secondary | ICD-10-CM | POA: Insufficient documentation

## 2016-01-21 DIAGNOSIS — Y999 Unspecified external cause status: Secondary | ICD-10-CM | POA: Diagnosis not present

## 2016-01-21 DIAGNOSIS — Y9389 Activity, other specified: Secondary | ICD-10-CM | POA: Insufficient documentation

## 2016-01-21 DIAGNOSIS — Y92410 Unspecified street and highway as the place of occurrence of the external cause: Secondary | ICD-10-CM | POA: Diagnosis not present

## 2016-01-21 DIAGNOSIS — S5011XA Contusion of right forearm, initial encounter: Secondary | ICD-10-CM | POA: Diagnosis not present

## 2016-01-21 DIAGNOSIS — E039 Hypothyroidism, unspecified: Secondary | ICD-10-CM | POA: Diagnosis not present

## 2016-01-21 DIAGNOSIS — S80811A Abrasion, right lower leg, initial encounter: Secondary | ICD-10-CM

## 2016-01-21 DIAGNOSIS — S8991XA Unspecified injury of right lower leg, initial encounter: Secondary | ICD-10-CM | POA: Diagnosis present

## 2016-01-21 MED ORDER — NAPROXEN 500 MG PO TABS: 500.0000 mg | ORAL_TABLET | Freq: Two times a day (BID) | ORAL | 0 refills | Status: DC

## 2016-01-21 MED ORDER — CYCLOBENZAPRINE HCL 10 MG PO TABS: 10.0000 mg | ORAL_TABLET | Freq: Three times a day (TID) | ORAL | 0 refills | Status: DC | PRN
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
## 2016-01-21 NOTE — ED Notes (Signed)
Patient transported to X-ray 

## 2016-01-21 NOTE — Discharge Instructions (Signed)
Keep right leg elevated when not ambulating.   Apply ice to affected areas x 20 minutes 3-4 times daily.

## 2016-01-21 NOTE — ED Notes (Signed)
Ice pack provided. PT undressing for xray. Family at bedside.

## 2016-02-29 ENCOUNTER — Telehealth: Payer: Self-pay | Admitting: Family Medicine

## 2016-02-29 NOTE — Telephone Encounter (Signed)
Patient has an upcoming appointment with PCP 03/06/16 is requesting refill on Levothyroxine no TSH since 2016

## 2016-03-01 MED ORDER — LEVOTHYROXINE SODIUM 75 MCG PO TABS: ORAL_TABLET | ORAL | 0 refills | Status: DC

## 2016-03-01 NOTE — Telephone Encounter (Signed)
Refill sent to pharmacy. We will need to recheck TSH at patient's visit.

## 2016-03-06 ENCOUNTER — Ambulatory Visit: Payer: Self-pay | Admitting: Family Medicine

## 2016-03-06 ENCOUNTER — Telehealth: Payer: Self-pay | Admitting: Family Medicine

## 2016-03-06 NOTE — Telephone Encounter (Signed)
Noted. If she continues to feel poorly she should be evaluated.

## 2016-03-06 NOTE — Telephone Encounter (Signed)
Noted  

## 2016-03-06 NOTE — Telephone Encounter (Signed)
fyi

## 2016-03-06 NOTE — Telephone Encounter (Signed)
Patient notified, patient has an appmt scheduled with DR cook to establish care, offered patient appmt on 03/15/16 to establish care. Patient was very unsure of who to go with. Patient will keep appointment with Dr.Cook.

## 2016-03-06 NOTE — Telephone Encounter (Signed)
FYI - Pt called to cancel appt. Has been vomiting every 30 mins since 4 am. 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

## 2016-03-16 ENCOUNTER — Ambulatory Visit (INDEPENDENT_AMBULATORY_CARE_PROVIDER_SITE_OTHER): Payer: Self-pay | Admitting: Family Medicine

## 2016-03-16 ENCOUNTER — Encounter: Payer: Self-pay | Admitting: Family Medicine

## 2016-03-16 VITALS — BP 141/83 | HR 75 | Temp 98.0°F | Ht 64.0 in | Wt 246.6 lb

## 2016-03-16 DIAGNOSIS — Z0000 Encounter for general adult medical examination without abnormal findings: Secondary | ICD-10-CM

## 2016-03-16 DIAGNOSIS — Z1239 Encounter for other screening for malignant neoplasm of breast: Secondary | ICD-10-CM

## 2016-03-16 DIAGNOSIS — Z0001 Encounter for general adult medical examination with abnormal findings: Secondary | ICD-10-CM | POA: Insufficient documentation

## 2016-03-16 DIAGNOSIS — R7989 Other specified abnormal findings of blood chemistry: Secondary | ICD-10-CM

## 2016-03-16 DIAGNOSIS — Z1231 Encounter for screening mammogram for malignant neoplasm of breast: Secondary | ICD-10-CM

## 2016-03-16 LAB — LIPID PANEL
CHOLESTEROL: 229 mg/dL — AB (ref 0–200)
HDL: 32.9 mg/dL — AB (ref >39.00)
NONHDL: 195.71
TRIGLYCERIDES: 333 mg/dL — AB (ref 0.0–149.0)
Total CHOL/HDL Ratio: 7
VLDL: 66.6 mg/dL — ABNORMAL HIGH (ref 0.0–40.0)

## 2016-03-16 LAB — COMPREHENSIVE METABOLIC PANEL
ALBUMIN: 4.2 g/dL (ref 3.5–5.2)
ALK PHOS: 62 U/L (ref 39–117)
ALT: 27 U/L (ref 0–35)
AST: 24 U/L (ref 0–37)
BILIRUBIN TOTAL: 0.4 mg/dL (ref 0.2–1.2)
BUN: 18 mg/dL (ref 6–23)
CALCIUM: 9.4 mg/dL (ref 8.4–10.5)
CO2: 29 meq/L (ref 19–32)
Chloride: 105 mEq/L (ref 96–112)
Creatinine, Ser: 0.8 mg/dL (ref 0.40–1.20)
GFR: 79.44 mL/min (ref >60.00)
Glucose, Bld: 110 mg/dL — ABNORMAL HIGH (ref 70–99)
Potassium: 4.3 mEq/L (ref 3.5–5.1)
Sodium: 140 mEq/L (ref 135–145)
Total Protein: 7 g/dL (ref 6.0–8.3)

## 2016-03-16 LAB — HEMOGLOBIN A1C: Hgb A1c MFr Bld: 6.1 % (ref 4.6–6.5)

## 2016-03-16 LAB — LDL CHOLESTEROL, DIRECT: Direct LDL: 126 mg/dL

## 2016-03-16 LAB — CBC
HEMATOCRIT: 38.1 % (ref 36.0–46.0)
HEMOGLOBIN: 12.4 g/dL (ref 12.0–15.0)
MCHC: 32.6 g/dL (ref 30.0–36.0)
MCV: 79.6 fl (ref 78.0–100.0)
PLATELETS: 302 10*3/uL (ref 150.0–400.0)
RBC: 4.78 Mil/uL (ref 3.87–5.11)
RDW: 15.3 % (ref 11.5–15.5)
WBC: 7.3 10*3/uL (ref 4.0–10.5)

## 2016-03-16 LAB — TSH: TSH: 5.78 u[IU]/mL — ABNORMAL HIGH (ref 0.35–4.50)

## 2016-03-16 NOTE — Progress Notes (Signed)
Subjective:  Patient ID: Kara Spears, female    DOB: 1960-09-27  Age: 56 y.o. MRN: [id number]  CC: Annual physical  HPI Kara Spears is a 56 y.o. female presents to the clinic today for an annual physical.  Preventative Healthcare  Pap smear: No longer needed (s/p hysterectomy due to endometriosis).  Mammogram: In need of.   Colonoscopy: Up to date.  Immunizations  Tetanus - Up to date.  Pneumococcal - N/A.  Flu - Declines.  Hepatitis C screening - Declines.  Labs: In need of labs today.  Alcohol use: No.  Smoking/tobacco use: No.  STD/HIV testing: Declines.  PMH, Surgical Hx, Family Hx, Social History reviewed and updated as below.  Past Medical History:  Diagnosis Date  . Acid reflux   . Chronic headaches   . Colon polyps   . Hypothyroidism   . Recurrent sinusitis   . Sleep apnea    not currently 07/28/13   Past Surgical History:  Procedure Laterality Date  . ABDOMINAL HYSTERECTOMY    . CESAREAN SECTION     x 2   . MENISCUS REPAIR Right     knee   Family History  Problem Relation Age of Onset  . Hypertension Mother   . Aortic aneurysm Mother     AAA  . Hyperlipidemia Mother   . Colon polyps Mother   . Cancer Mother     NSCLC  . Aneurysm Father   . Heart attack Father   . Breast cancer Maternal Aunt 39  . Multiple myeloma Maternal Grandmother   . Pancreatic cancer Maternal Uncle    Social History  Substance Use Topics  . Smoking status: Never Smoker  . Smokeless tobacco: Never Used  . Alcohol use No   Review of Systems  Skin:       Healing wound (lower leg, right); from recent injury.  All other systems reviewed and are negative.  Objective:   Today's Vitals: BP (!) 141/83   Pulse 75   Temp 98 F (36.7 C) (Oral)   Ht '5\' 4"'$  (1.626 m)   Wt 246 lb 9.6 oz (111.9 kg)   SpO2 97%   BMI 42.33 kg/m   Physical Exam  Constitutional: She is oriented to person, place, and time. She appears well-developed and  well-nourished. No distress.  HENT:  Head: Normocephalic and atraumatic.  Nose: Nose normal.  Mouth/Throat: Oropharynx is clear and moist. No oropharyngeal exudate.  Normal TM's bilaterally.   Eyes: Conjunctivae are normal. No scleral icterus.  Neck: Neck supple.  Cardiovascular: Normal rate and regular rhythm.   No murmur heard. Pulmonary/Chest: Effort normal and breath sounds normal. She has no wheezes. She has no rales.  Abdominal: Soft. She exhibits no distension. There is no tenderness. There is no rebound and no guarding.  Musculoskeletal: Normal range of motion. She exhibits no edema.  Lymphadenopathy:    She has no cervical adenopathy.  Neurological: She is alert and oriented to person, place, and time.  Skin: Skin is warm and dry. No rash noted.  Psychiatric: She has a normal mood and affect.  Vitals reviewed.  Assessment & Plan:   Problem List Items Addressed This Visit    Encounter for health maintenance examination with abnormal findings - Primary    Pap smear no longer needed. Declined flu shot.  Colonoscopy up to date. Screening labs today. Declined HIV/Hep C screening. Mammogram ordered.  BP elevated today and on repeat. Patient will check BPs daily at home and  send me readings in 2 weeks. Needs weight loss. May need antihypertensives. I cannot yet confirm hypertension.      Relevant Orders   CBC   Hemoglobin A1c   Comprehensive metabolic panel   Lipid panel   TSH    Other Visit Diagnoses    Breast cancer screening       Relevant Orders   MM Digital Screening     Outpatient Encounter Prescriptions as of 03/16/2016  Medication Sig  . levothyroxine (SYNTHROID, LEVOTHROID) 75 MCG tablet TAKE 1 TABLET (75 MCG TOTAL) BY MOUTH DAILY.  . [DISCONTINUED] cyclobenzaprine (FLEXERIL) 10 MG tablet Take 1 tablet (10 mg total) by mouth 3 (three) times daily as needed for muscle spasms.  . [DISCONTINUED] dexlansoprazole (DEXILANT) 60 MG capsule Take 1 capsule (60 mg  total) by mouth daily.  . [DISCONTINUED] DULoxetine (CYMBALTA) 20 MG capsule Take 1 capsule (20 mg total) by mouth daily. (Patient not taking: Reported on 01/21/2015)  . [DISCONTINUED] ergocalciferol (VITAMIN D2) 50000 UNITS capsule Take 1 capsule (50,000 Units total) by mouth once a week. (Patient not taking: Reported on 01/21/2015)  . [DISCONTINUED] naproxen (NAPROSYN) 500 MG tablet Take 1 tablet (500 mg total) by mouth 2 (two) times daily with a meal.   No facility-administered encounter medications on file as of 03/16/2016.     Follow-up: Annual  Thersa Salt DO [facility]

## 2016-03-16 NOTE — Patient Instructions (Addendum)
Check your BP's at home. If consistently >130/80 let me know.  Health Maintenance, Female Introduction Adopting a healthy lifestyle and getting preventive care can go a long way to promote health and wellness. Talk with your health care provider about what schedule of regular examinations is right for you. This is a good chance for you to check in with your provider about disease prevention and staying healthy. In between checkups, there are plenty of things you can do on your own. Experts have done a lot of research about which lifestyle changes and preventive measures are most likely to keep you healthy. Ask your health care provider for more information. Weight and diet Eat a healthy diet  Be sure to include plenty of vegetables, fruits, low-fat dairy products, and lean protein.  Do not eat a lot of foods high in solid fats, added sugars, or salt.  Get regular exercise. This is one of the most important things you can do for your health.  Most adults should exercise for at least 150 minutes each week. The exercise should increase your heart rate and make you sweat (moderate-intensity exercise).  Most adults should also do strengthening exercises at least twice a week. This is in addition to the moderate-intensity exercise. Maintain a healthy weight  Body mass index (BMI) is a measurement that can be used to identify possible weight problems. It estimates body fat based on height and weight. Your health care provider can help determine your BMI and help you achieve or maintain a healthy weight.  For females 48 years of age and older:  A BMI below 18.5 is considered underweight.  A BMI of 18.5 to 24.9 is normal.  A BMI of 25 to 29.9 is considered overweight.  A BMI of 30 and above is considered obese. Watch levels of cholesterol and blood lipids  You should start having your blood tested for lipids and cholesterol at 54 years of age, then have this test every 5 years.  You may  need to have your cholesterol levels checked more often if:  Your lipid or cholesterol levels are high.  You are older than 54 years of age.  You are at high risk for heart disease. Cancer screening Lung Cancer  Lung cancer screening is recommended for adults 52-15 years old who are at high risk for lung cancer because of a history of smoking.  A yearly low-dose CT scan of the lungs is recommended for people who:  Currently smoke.  Have quit within the past 15 years.  Have at least a 30-pack-year history of smoking. A pack year is smoking an average of one pack of cigarettes a day for 1 year.  Yearly screening should continue until it has been 15 years since you quit.  Yearly screening should stop if you develop a health problem that would prevent you from having lung cancer treatment. Breast Cancer  Practice breast self-awareness. This means understanding how your breasts normally appear and feel.  It also means doing regular breast self-exams. Let your health care provider know about any changes, no matter how small.  If you are in your 20s or 30s, you should have a clinical breast exam (CBE) by a health care provider every 1-3 years as part of a regular health exam.  If you are 14 or older, have a CBE every year. Also consider having a breast X-ray (mammogram) every year.  If you have a family history of breast cancer, talk to your health care provider about  genetic screening.  If you are at high risk for breast cancer, talk to your health care provider about having an MRI and a mammogram every year.  Breast cancer gene (BRCA) assessment is recommended for women who have family members with BRCA-related cancers. BRCA-related cancers include:  Breast.  Ovarian.  Tubal.  Peritoneal cancers.  Results of the assessment will determine the need for genetic counseling and BRCA1 and BRCA2 testing. Cervical Cancer  Your health care provider may recommend that you be  screened regularly for cancer of the pelvic organs (ovaries, uterus, and vagina). This screening involves a pelvic examination, including checking for microscopic changes to the surface of your cervix (Pap test). You may be encouraged to have this screening done every 3 years, beginning at age 86.  For women ages 44-65, health care providers may recommend pelvic exams and Pap testing every 3 years, or they may recommend the Pap and pelvic exam, combined with testing for human papilloma virus (HPV), every 5 years. Some types of HPV increase your risk of cervical cancer. Testing for HPV may also be done on women of any age with unclear Pap test results.  Other health care providers may not recommend any screening for nonpregnant women who are considered low risk for pelvic cancer and who do not have symptoms. Ask your health care provider if a screening pelvic exam is right for you.  If you have had past treatment for cervical cancer or a condition that could lead to cancer, you need Pap tests and screening for cancer for at least 20 years after your treatment. If Pap tests have been discontinued, your risk factors (such as having a new sexual partner) need to be reassessed to determine if screening should resume. Some women have medical problems that increase the chance of getting cervical cancer. In these cases, your health care provider may recommend more frequent screening and Pap tests. Colorectal Cancer  This type of cancer can be detected and often prevented.  Routine colorectal cancer screening usually begins at 54 years of age and continues through 54 years of age.  Your health care provider may recommend screening at an earlier age if you have risk factors for colon cancer.  Your health care provider may also recommend using home test kits to check for hidden blood in the stool.  A small camera at the end of a tube can be used to examine your colon directly (sigmoidoscopy or colonoscopy).  This is done to check for the earliest forms of colorectal cancer.  Routine screening usually begins at age 31.  Direct examination of the colon should be repeated every 5-10 years through 54 years of age. However, you may need to be screened more often if early forms of precancerous polyps or small growths are found. Skin Cancer  Check your skin from head to toe regularly.  Tell your health care provider about any new moles or changes in moles, especially if there is a change in a mole's shape or color.  Also tell your health care provider if you have a mole that is larger than the size of a pencil eraser.  Always use sunscreen. Apply sunscreen liberally and repeatedly throughout the day.  Protect yourself by wearing long sleeves, pants, a wide-brimmed hat, and sunglasses whenever you are outside. Heart disease, diabetes, and high blood pressure  High blood pressure causes heart disease and increases the risk of stroke. High blood pressure is more likely to develop in:  People who have blood  pressure in the high end of the normal range (130-139/85-89 mm Hg).  People who are overweight or obese.  People who are African American.  If you are 4-38 years of age, have your blood pressure checked every 3-5 years. If you are 41 years of age or older, have your blood pressure checked every year. You should have your blood pressure measured twice-once when you are at a hospital or clinic, and once when you are not at a hospital or clinic. Record the average of the two measurements. To check your blood pressure when you are not at a hospital or clinic, you can use:  An automated blood pressure machine at a pharmacy.  A home blood pressure monitor.  If you are between 74 years and 40 years old, ask your health care provider if you should take aspirin to prevent strokes.  Have regular diabetes screenings. This involves taking a blood sample to check your fasting blood sugar level.  If you  are at a normal weight and have a low risk for diabetes, have this test once every three years after 54 years of age.  If you are overweight and have a high risk for diabetes, consider being tested at a younger age or more often. Preventing infection Hepatitis B  If you have a higher risk for hepatitis B, you should be screened for this virus. You are considered at high risk for hepatitis B if:  You were born in a country where hepatitis B is common. Ask your health care provider which countries are considered high risk.  Your parents were born in a high-risk country, and you have not been immunized against hepatitis B (hepatitis B vaccine).  You have HIV or AIDS.  You use needles to inject street drugs.  You live with someone who has hepatitis B.  You have had sex with someone who has hepatitis B.  You get hemodialysis treatment.  You take certain medicines for conditions, including cancer, organ transplantation, and autoimmune conditions. Hepatitis C  Blood testing is recommended for:  Everyone born from [birth year] through 1963.  Anyone with known risk factors for hepatitis C. Sexually transmitted infections (STIs)  You should be screened for sexually transmitted infections (STIs) including gonorrhea and chlamydia if:  You are sexually active and are younger than 54 years of age.  You are older than 54 years of age and your health care provider tells you that you are at risk for this type of infection.  Your sexual activity has changed since you were last screened and you are at an increased risk for chlamydia or gonorrhea. Ask your health care provider if you are at risk.  If you do not have HIV, but are at risk, it may be recommended that you take a prescription medicine daily to prevent HIV infection. This is called pre-exposure prophylaxis (PrEP). You are considered at risk if:  You are sexually active and do not regularly use condoms or know the HIV status of your  partner(s).  You take drugs by injection.  You are sexually active with a partner who has HIV. Talk with your health care provider about whether you are at high risk of being infected with HIV. If you choose to begin PrEP, you should first be tested for HIV. You should then be tested every 3 months for as long as you are taking PrEP. Pregnancy  If you are premenopausal and you may become pregnant, ask your health care provider about preconception counseling.  If  you may become pregnant, take 400 to 800 micrograms (mcg) of folic acid every day.  If you want to prevent pregnancy, talk to your health care provider about birth control (contraception). Osteoporosis and menopause  Osteoporosis is a disease in which the bones lose minerals and strength with aging. This can result in serious bone fractures. Your risk for osteoporosis can be identified using a bone density scan.  If you are 82 years of age or older, or if you are at risk for osteoporosis and fractures, ask your health care provider if you should be screened.  Ask your health care provider whether you should take a calcium or vitamin D supplement to lower your risk for osteoporosis.  Menopause may have certain physical symptoms and risks.  Hormone replacement therapy may reduce some of these symptoms and risks. Talk to your health care provider about whether hormone replacement therapy is right for you. Follow these instructions at home:  Schedule regular health, dental, and eye exams.  Stay current with your immunizations.  Do not use any tobacco products including cigarettes, chewing tobacco, or electronic cigarettes.  If you are pregnant, do not drink alcohol.  If you are breastfeeding, limit how much and how often you drink alcohol.  Limit alcohol intake to no more than 1 drink per day for nonpregnant women. One drink equals 12 ounces of beer, 5 ounces of wine, or 1 ounces of hard liquor.  Do not use street  drugs.  Do not share needles.  Ask your health care provider for help if you need support or information about quitting drugs.  Tell your health care provider if you often feel depressed.  Tell your health care provider if you have ever been abused or do not feel safe at home. This information is not intended to replace advice given to you by your health care provider. Make sure you discuss any questions you have with your health care provider. Document Released: 08/15/2010 Document Revised: 07/08/2015 Document Reviewed: 11/03/2014  2017 Elsevier

## 2016-03-16 NOTE — Assessment & Plan Note (Addendum)
Pap smear no longer needed. Declined flu shot.  Colonoscopy up to date. Screening labs today. Declined HIV/Hep C screening. Mammogram ordered.  BP elevated today and on repeat. Patient will check BPs daily at home and send me readings in 2 weeks. Needs weight loss. May need antihypertensives. I cannot yet confirm hypertension.

## 2016-04-04 ENCOUNTER — Other Ambulatory Visit: Payer: Self-pay | Admitting: Family Medicine

## 2016-04-04 MED ORDER — LEVOTHYROXINE SODIUM 75 MCG PO TABS: ORAL_TABLET | ORAL | 3 refills | Status: DC

## 2016-04-04 NOTE — Telephone Encounter (Signed)
Will forward to Dr Lacinda Axon as he saw the patient for the physical. Is this patient coming to see me as her PCP or is she seeing Dr Lacinda Axon? I have never seen her before.

## 2016-04-04 NOTE — Telephone Encounter (Signed)
Pt called to follow up on her medication of levothyroxine (SYNTHROID, LEVOTHROID) 75 MCG tablet. Pt states the mg was going to be increased to 100. Please advise?  Pharmacy is [id number] - [place], [address]  Call pt @ [phone]. Thank you!

## 2016-04-04 NOTE — Telephone Encounter (Signed)
Refilled and sent   

## 2016-04-04 NOTE — Telephone Encounter (Signed)
Pt sees Dr. Lacinda Axon. Changed PCP in demographics

## 2016-04-04 NOTE — Telephone Encounter (Signed)
Please advise about rx refill

## 2016-04-04 NOTE — Telephone Encounter (Signed)
Left message to return call 

## 2016-04-04 NOTE — Telephone Encounter (Signed)
Please send in new dose as mentioned on lab result 03/16/16

## 2016-04-11 ENCOUNTER — Other Ambulatory Visit: Payer: Self-pay | Admitting: Family Medicine

## 2016-04-11 ENCOUNTER — Telehealth: Payer: Self-pay | Admitting: Family Medicine

## 2016-04-11 MED ORDER — LEVOTHYROXINE SODIUM 100 MCG PO TABS: 100.0000 ug | ORAL_TABLET | Freq: Every day | ORAL | 0 refills | Status: AC

## 2016-04-11 NOTE — Telephone Encounter (Signed)
Pt called stating that her new medication was never called into her pharmacy. Her old rx was called in instead due to lab result statin PCP had sent new rx for increase dose. Please call in new rx for increase dosage. Pt currently taking 75 mcg. Please advise?

## 2016-04-11 NOTE — Telephone Encounter (Signed)
Sorry about that. New Rx sent. Needs repeat labs in 6 weeks.

## 2016-04-11 NOTE — Telephone Encounter (Signed)
pt has been scheduled for repeat labs

## 2016-04-11 NOTE — Telephone Encounter (Signed)
Called left voicemail for pt to callback. She will need 6 weeks lab appt. Please schedule when she callback.

## 2016-05-24 ENCOUNTER — Telehealth: Payer: Self-pay | Admitting: Radiology

## 2016-05-24 NOTE — Telephone Encounter (Signed)
Pt coming in for labs tomorrow, please place future orders. Thank you.  

## 2016-05-25 ENCOUNTER — Other Ambulatory Visit: Payer: Self-pay | Admitting: Family Medicine

## 2016-05-25 ENCOUNTER — Other Ambulatory Visit: Payer: Self-pay

## 2016-05-25 DIAGNOSIS — E039 Hypothyroidism, unspecified: Secondary | ICD-10-CM
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
## 2018-06-20 ENCOUNTER — Encounter: Payer: Self-pay | Admitting: Internal Medicine

## 2019-12-25 DIAGNOSIS — Z8616 Personal history of COVID-19: Secondary | ICD-10-CM | POA: Insufficient documentation

## 2020-12-29 DIAGNOSIS — E538 Deficiency of other specified B group vitamins: Secondary | ICD-10-CM | POA: Insufficient documentation

## 2020-12-29 DIAGNOSIS — D369 Benign neoplasm, unspecified site: Secondary | ICD-10-CM | POA: Insufficient documentation

## 2021-10-29 DIAGNOSIS — J019 Acute sinusitis, unspecified: Secondary | ICD-10-CM | POA: Diagnosis not present

## 2022-01-27 DIAGNOSIS — Z0000 Encounter for general adult medical examination without abnormal findings: Secondary | ICD-10-CM | POA: Diagnosis not present

## 2022-02-02 DIAGNOSIS — E538 Deficiency of other specified B group vitamins: Secondary | ICD-10-CM | POA: Diagnosis not present

## 2022-02-02 DIAGNOSIS — D369 Benign neoplasm, unspecified site: Secondary | ICD-10-CM | POA: Diagnosis not present

## 2022-02-02 DIAGNOSIS — Z0000 Encounter for general adult medical examination without abnormal findings: Secondary | ICD-10-CM | POA: Diagnosis not present

## 2022-04-28 ENCOUNTER — Other Ambulatory Visit: Payer: Self-pay | Admitting: Internal Medicine

## 2022-04-28 DIAGNOSIS — Z1231 Encounter for screening mammogram for malignant neoplasm of breast: Secondary | ICD-10-CM

## 2022-05-17 ENCOUNTER — Ambulatory Visit
Admission: RE | Admit: 2022-05-17 | Discharge: 2022-05-17 | Disposition: A | Discharge status: Home / Self Care | Payer: 59 | Source: Ambulatory Visit | Attending: Internal Medicine | Admitting: Internal Medicine

## 2022-05-17 DIAGNOSIS — Z1231 Encounter for screening mammogram for malignant neoplasm of breast: Secondary | ICD-10-CM | POA: Insufficient documentation

## 2022-05-19 ENCOUNTER — Inpatient Hospital Stay
Admission: RE | Admit: 2022-05-19 | Discharge: 2022-05-19 | Disposition: A | Discharge status: Home / Self Care | Payer: Self-pay | Source: Ambulatory Visit | Attending: Family Medicine | Admitting: Family Medicine

## 2022-05-19 ENCOUNTER — Other Ambulatory Visit: Payer: Self-pay | Admitting: *Deleted

## 2022-05-19 DIAGNOSIS — Z1231 Encounter for screening mammogram for malignant neoplasm of breast: Secondary | ICD-10-CM

## 2022-06-16 DIAGNOSIS — R609 Edema, unspecified: Secondary | ICD-10-CM | POA: Insufficient documentation

## 2022-06-16 DIAGNOSIS — M2240 Chondromalacia patellae, unspecified knee: Secondary | ICD-10-CM | POA: Insufficient documentation

## 2022-06-16 DIAGNOSIS — R269 Unspecified abnormalities of gait and mobility: Secondary | ICD-10-CM | POA: Insufficient documentation

## 2022-06-16 DIAGNOSIS — M25669 Stiffness of unspecified knee, not elsewhere classified: Secondary | ICD-10-CM | POA: Insufficient documentation

## 2022-06-16 DIAGNOSIS — M23329 Other meniscus derangements, posterior horn of medial meniscus, unspecified knee: Secondary | ICD-10-CM | POA: Insufficient documentation

## 2022-06-20 ENCOUNTER — Encounter: Payer: Self-pay | Admitting: Internal Medicine

## 2022-06-23 NOTE — Progress Notes (Unsigned)
Pt's name and DOB verified at the beginning of the pre-visit.  Pt denies any difficulty with ambulating,sitting, laying down or rolling side to side Gave both [facility] main # and MD on call # prior to instructions.  No egg or soy allergy known to patient  No issues known to pt with past sedation with any surgeries or procedures Pt denies having issues being intubated Patient denies ever being intubated Pt has no issues moving head neck or swallowing No FH of Malignant Hyperthermia Pt is not on diet pills Pt is not on home 02  Pt is not on blood thinners  Pt denies issues with constipation  Pt has frequent issues with constipation RN instructed pt to use Miralax per bottles instructions a week before prep days. Pt states they will Pt is not on dialysis Pt denise any abnormal heart rhythms  Pt denies any upcoming cardiac testing Pt encouraged to use to use Singlecare or Goodrx to reduce cost  Patient's chart reviewed by Cathlyn Parsons CNRA prior to pre-visit and patient appropriate for the [facility].  Pre-visit completed and red dot placed by patient's name on their procedure day (on provider's schedule).  . Visit by phone Pt states weight is  Instructed pt why it is important to and  to call if they have any changes in health or new medications. Directed them to the # given and on instructions.   Pt states they will.  Instructions reviewed with pt and pt states understanding. Instructed to review again prior to procedure. Pt states they will.  Instructions sent by mail with coupon and by my chart

## 2022-07-17 ENCOUNTER — Encounter: Payer: Self-pay | Admitting: Internal Medicine

## 2022-07-31 ENCOUNTER — Ambulatory Visit (AMBULATORY_SURGERY_CENTER): Payer: 59

## 2022-07-31 VITALS — Ht 64.0 in | Wt 225.0 lb

## 2022-07-31 DIAGNOSIS — Z86010 Personal history of colonic polyps: Secondary | ICD-10-CM

## 2022-07-31 MED ORDER — NA SULFATE-K SULFATE-MG SULF 17.5-3.13-1.6 GM/177ML PO SOLN
1.0000 | Freq: Once | ORAL | 0 refills | Status: AC
Start: 2022-07-31 — End: 2022-07-31

## 2022-07-31 NOTE — Progress Notes (Signed)
No egg or soy allergy known to patient  No issues known to pt with past sedation with any surgeries or procedures Patient denies ever being told they had issues or difficulty with intubation  No FH of Malignant Hyperthermia Pt is not on diet pills Pt is not on  home 02  Pt is not on blood thinners  Pt reports occ constipation maybe once every 2 weeks or so due to intermit fasting (not eating or drinking enough) - instructions given for extra miralax  No A fib or A flutter Have any cardiac testing pending--no  LOA: independent   Patient's chart reviewed by Cathlyn Parsons CNRA prior to previsit and patient appropriate for the [facility].  Previsit completed and red dot placed by patient's name on their procedure day (on provider's schedule).      PV complete. Prep reviewed with patient. Instructions sent via mychart and to address on file. [facility] coupon for [facility] provided  Pt instructed to use Singlecare.com or GoodRx for a price reduction on prep if needed

## 2022-08-31 ENCOUNTER — Encounter: Payer: Self-pay | Admitting: Internal Medicine

## 2022-08-31 ENCOUNTER — Encounter: Payer: 59 | Admitting: Internal Medicine

## 2022-09-12 ENCOUNTER — Encounter: Payer: Self-pay | Admitting: Internal Medicine

## 2022-09-13 ENCOUNTER — Encounter: Payer: Self-pay | Admitting: Internal Medicine

## 2022-09-22 ENCOUNTER — Encounter: Payer: Self-pay | Admitting: Internal Medicine

## 2022-09-22 ENCOUNTER — Ambulatory Visit (AMBULATORY_SURGERY_CENTER): Payer: 59 | Admitting: Internal Medicine

## 2022-09-22 VITALS — BP 127/76 | HR 75 | Temp 98.4°F | Resp 16 | Ht 64.0 in | Wt 225.0 lb

## 2022-09-22 DIAGNOSIS — K6389 Other specified diseases of intestine: Secondary | ICD-10-CM

## 2022-09-22 DIAGNOSIS — K635 Polyp of colon: Secondary | ICD-10-CM

## 2022-09-22 DIAGNOSIS — D123 Benign neoplasm of transverse colon: Secondary | ICD-10-CM

## 2022-09-22 DIAGNOSIS — Z86010 Personal history of colonic polyps: Secondary | ICD-10-CM | POA: Diagnosis not present

## 2022-09-22 DIAGNOSIS — Z09 Encounter for follow-up examination after completed treatment for conditions other than malignant neoplasm: Secondary | ICD-10-CM | POA: Diagnosis present

## 2022-09-22 DIAGNOSIS — D120 Benign neoplasm of cecum: Secondary | ICD-10-CM

## 2022-09-22 HISTORY — PX: COLONOSCOPY: SHX174

## 2022-09-22 MED ORDER — SODIUM CHLORIDE 0.9 % IV SOLN
500.0000 mL | Freq: Once | INTRAVENOUS | Status: DC
Start: 2022-09-22 — End: 2022-09-22

## 2022-09-22 NOTE — Patient Instructions (Signed)
 Resume all of your previous medications today.  Read all of the handouts given to you by your recovery room nurse.  YOU HAD AN ENDOSCOPIC PROCEDURE TODAY AT THE [facility]:   Refer to the procedure report that was given to you for any specific questions about what was found during the examination.  If the procedure report does not answer your questions, please call your gastroenterologist to clarify.  If you requested that your care partner not be given the details of your procedure findings, then the procedure report has been included in a sealed envelope for you to review at your convenience later.  YOU SHOULD EXPECT: Some feelings of bloating in the abdomen. Passage of more gas than usual.  Walking can help get rid of the air that was put into your GI tract during the procedure and reduce the bloating. If you had a lower endoscopy (such as a colonoscopy or flexible sigmoidoscopy) you may notice spotting of blood in your stool or on the toilet paper. If you underwent a bowel prep for your procedure, you may not have a normal bowel movement for a few days.  Please Note:  You might notice some irritation and congestion in your nose or some drainage.  This is from the oxygen used during your procedure.  There is no need for concern and it should clear up in a day or so.  SYMPTOMS TO REPORT IMMEDIATELY:  Following lower endoscopy (colonoscopy or flexible sigmoidoscopy):  Excessive amounts of blood in the stool  Significant tenderness or worsening of abdominal pains  Swelling of the abdomen that is new, acute  Fever of 100F or higher   For urgent or emergent issues, a gastroenterologist can be reached at any hour by calling [phone]. Do not use MyChart messaging for urgent concerns.    DIET:  We do recommend a small meal at first, but then you may proceed to your regular diet.  Drink plenty of fluids but you should avoid alcoholic beverages for 24 hours. Try to eat a high  fiber diet, and drink plenty of water.  ACTIVITY:  You should plan to take it easy for the rest of today and you should NOT DRIVE or use heavy machinery until tomorrow (because of the sedation medicines used during the test).    FOLLOW UP: Our staff will call the number listed on your records the next business day following your procedure.  We will call around 7:15- 8:00 am to check on you and address any questions or concerns that you may have regarding the information given to you following your procedure. If we do not reach you, we will leave a message.     If any biopsies were taken you will be contacted by phone or by letter within the next 1-3 weeks.  Please call us at [phone] if you have not heard about the biopsies in 3 weeks.    SIGNATURES/CONFIDENTIALITY: You and/or your care partner have signed paperwork which will be entered into your electronic medical record.  These signatures attest to the fact that that the information above on your After Visit Summary has been reviewed and is understood.  Full responsibility of the confidentiality of this discharge information lies with you and/or your care-partner.

## 2022-09-22 NOTE — Progress Notes (Signed)
Pt's states no medical or surgical changes since previsit or office visit. 

## 2022-09-22 NOTE — Progress Notes (Signed)
Report to PACU, RN, vss, BBS= Clear.  

## 2022-09-22 NOTE — Op Note (Signed)
[facility] Patient Name: Kara Spears Procedure Date: 09/22/2022 3:54 PM MRN: [id number] Endoscopist: Beverley Fiedler , MD, [id number] Age: 62 Referring MD:  Date of Birth: 1960-05-11 Gender: Female Account #: [id number] Procedure:                Colonoscopy Indications:              High risk colon cancer surveillance: Personal                            history of non-advanced adenomas, Last colonoscopy:                            June 2015 (TA x 2) Medicines:                Monitored Anesthesia Care Procedure:                Pre-Anesthesia Assessment:                           - Prior to the procedure, a History and Physical                            was performed, and patient medications and                            allergies were reviewed. The patient's tolerance of                            previous anesthesia was also reviewed. The risks                            and benefits of the procedure and the sedation                            options and risks were discussed with the patient.                            All questions were answered, and informed consent                            was obtained. Prior Anticoagulants: The patient has                            taken no anticoagulant or antiplatelet agents. ASA                            Grade Assessment: II - A patient with mild systemic                            disease. After reviewing the risks and benefits,                            the patient was deemed in satisfactory condition to  undergo the procedure.                           After obtaining informed consent, the colonoscope                            was passed under direct vision. Throughout the                            procedure, the patient's blood pressure, pulse, and                            oxygen saturations were monitored continuously. The                            Olympus Scope SN: [id number] was introduced  through                            the anus and advanced to the cecum, identified by                            appendiceal orifice and ileocecal valve. The                            colonoscopy was performed without difficulty. The                            patient tolerated the procedure well. The quality                            of the bowel preparation was good. The ileocecal                            valve, appendiceal orifice, and rectum were                            photographed. Scope In: 3:59:02 PM Scope Out: 4:14:28 PM Scope Withdrawal Time: 0 hours 13 minutes 14 seconds  Total Procedure Duration: 0 hours 15 minutes 26 seconds  Findings:                 The digital rectal exam was normal.                           Two sessile polyps were found in the cecum. The                            polyps were 1 to 2 mm in size. These polyps were                            removed with a cold biopsy forceps. Resection and                            retrieval were complete.  Two sessile polyps were found in the transverse                            colon. The polyps were 3 to 5 mm in size. These                            polyps were removed with a cold snare. Resection                            and retrieval were complete.                           Multiple small-mouthed diverticula were found in                            the sigmoid colon, descending colon and transverse                            colon.                           The retroflexed view of the distal rectum and anal                            verge was normal and showed no anal or rectal                            abnormalities. Complications:            No immediate complications. Estimated Blood Loss:     Estimated blood loss: none. Impression:               - Two 1 to 2 mm polyps in the cecum, removed with a                            cold biopsy forceps. Resected and retrieved.                            - Two 3 to 5 mm polyps in the transverse colon,                            removed with a cold snare. Resected and retrieved.                           - Mild to moderate diverticulosis in the sigmoid                            colon, in the descending colon and in the                            transverse colon.                           - The distal rectum and anal verge are normal on  retroflexion view. Recommendation:           - Patient has a contact number available for                            emergencies. The signs and symptoms of potential                            delayed complications were discussed with the                            patient. Return to normal activities tomorrow.                            Written discharge instructions were provided to the                            patient.                           - Resume previous diet.                           - Continue present medications.                           - Await pathology results.                           - Repeat colonoscopy is recommended for                            surveillance. The colonoscopy date will be                            determined after pathology results from today's                            exam become available for review. Beverley Fiedler, MD 09/22/2022 4:17:09 PM This report has been signed electronically.

## 2022-09-22 NOTE — Progress Notes (Signed)
Called to room to assist during endoscopic procedure.  Patient ID and intended procedure confirmed with present staff. Received instructions for my participation in the procedure from the performing physician.  

## 2022-09-22 NOTE — Progress Notes (Signed)
GASTROENTEROLOGY PROCEDURE H&P NOTE   Primary Care Physician: Danella Penton, MD    Reason for Procedure:  Personal history of adenomatous colon polyp  Plan:    Colonoscopy  Patient is appropriate for endoscopic procedure(s) in the ambulatory ([facility]) setting.  The nature of the procedure, as well as the risks, benefits, and alternatives were carefully and thoroughly reviewed with the patient. Ample time for discussion and questions allowed. The patient understood, was satisfied, and agreed to proceed.     HPI: Kara Spears is a 62 y.o. female who presents for colonoscopy.  Medical history as below.  Tolerated the prep.  No recent chest pain or shortness of breath.  No abdominal pain today.  Past Medical History:  Diagnosis Date   Acid reflux    Chronic headaches    Colon polyps    Hypothyroidism    Recurrent sinusitis     Past Surgical History:  Procedure Laterality Date   ABDOMINAL HYSTERECTOMY     CESAREAN SECTION     x 2    MENISCUS REPAIR Right     knee    Prior to Admission medications   Medication Sig Start Date End Date Taking? Authorizing Provider  diphenhydramine-acetaminophen (TYLENOL PM EXTRA STRENGTH) 25-500 MG TABS tablet Take 0.5 tablets by mouth at bedtime.   Yes [provider]  levothyroxine (SYNTHROID, LEVOTHROID) 100 MCG tablet Take 1 tablet (100 mcg total) by mouth daily. 04/11/16  Yes Cook, Jayce G, DO  cyanocobalamin (VITAMIN B12) 1000 MCG/ML injection Inject 1,000 mcg into the muscle every 30 (thirty) days. 08/28/22   [provider]    Current Outpatient Medications  Medication Sig Dispense Refill   diphenhydramine-acetaminophen (TYLENOL PM EXTRA STRENGTH) 25-500 MG TABS tablet Take 0.5 tablets by mouth at bedtime.     levothyroxine (SYNTHROID, LEVOTHROID) 100 MCG tablet Take 1 tablet (100 mcg total) by mouth daily. 90 tablet 0   cyanocobalamin (VITAMIN B12) 1000 MCG/ML injection Inject 1,000 mcg into the muscle every  30 (thirty) days.     Current Facility-Administered Medications  Medication Dose Route Frequency Provider Last Rate Last Admin   0.9 %  sodium chloride infusion  500 mL Intravenous Once Deaisa Merida, Carie Caddy, MD        Allergies as of 09/22/2022 - Review Complete 09/22/2022  Allergen Reaction Noted   Penicillins Rash 06/10/2019   Celexa [citalopram hydrobromide]  06/15/2011   Demerol [meperidine]  07/28/2013   Meperidine hcl     Morphine  11/16/2020    Family History  Problem Relation Age of Onset   Hypertension Mother    Aortic aneurysm Mother        AAA   Hyperlipidemia Mother    Cancer Mother        NSCLC   Aneurysm Father    Heart attack Father    Breast cancer Maternal Aunt 59   Pancreatic cancer Maternal Uncle    Multiple myeloma Maternal Grandmother    Colon cancer Neg Hx    Stomach cancer Neg Hx    Rectal cancer Neg Hx     Social History   Socioeconomic History   Marital status: Widowed    Spouse name: Not on file   Number of children: 2   Years of education: Not on file   Highest education level: Not on file  Occupational History   Occupation: Daycare     Employer: [employer]  Tobacco Use   Smoking status: Never   Smokeless tobacco:  Never  Vaping Use   Vaping status: Never Used  Substance and Sexual Activity   Alcohol use: No   Drug use: No   Sexual activity: Not on file  Other Topics Concern   Not on file  Social History Narrative   Lives in [place]    Social Determinants of Health   Financial Resource Strain: Not on file  Food Insecurity: Not on file  Transportation Needs: Not on file  Physical Activity: Not on file  Stress: Not on file  Social Connections: Not on file  Intimate Partner Violence: Not on file    Physical Exam: Vital signs in last 24 hours: @BP  123/73   Pulse 88   Temp 98.4 F (36.9 C) (Temporal)   Ht 5\' 4"  (1.626 m)   Wt 225 lb (102.1 kg)   SpO2 97%   BMI 38.62 kg/m  GEN: NAD EYE: Sclerae anicteric ENT: MMM CV:  Non-tachycardic Pulm: CTA b/l GI: Soft, NT/ND NEURO:  Alert & Oriented x 3   Erick Blinks, MD [facility]  09/22/2022 3:52 PM

## 2022-09-26 ENCOUNTER — Telehealth: Payer: Self-pay

## 2022-09-26 NOTE — Telephone Encounter (Signed)
  Follow up Call-     09/22/2022    3:20 PM  Call back number  Post procedure Call Back phone  # [phone]  Permission to leave phone message Yes     Left message

## 2022-09-29 ENCOUNTER — Encounter: Payer: Self-pay | Admitting: Internal Medicine

## 2023-03-01 ENCOUNTER — Other Ambulatory Visit: Payer: Self-pay | Admitting: Internal Medicine

## 2023-03-01 DIAGNOSIS — E7801 Familial hypercholesterolemia: Secondary | ICD-10-CM

## 2023-03-01 DIAGNOSIS — Z0000 Encounter for general adult medical examination without abnormal findings: Secondary | ICD-10-CM

## 2023-03-05 ENCOUNTER — Ambulatory Visit
Admission: RE | Admit: 2023-03-05 | Discharge: 2023-03-05 | Disposition: A | Discharge status: Home / Self Care | Payer: Self-pay | Source: Ambulatory Visit | Attending: Internal Medicine | Admitting: Internal Medicine

## 2023-03-05 DIAGNOSIS — E7801 Familial hypercholesterolemia: Secondary | ICD-10-CM

## 2023-03-05 DIAGNOSIS — Z0000 Encounter for general adult medical examination without abnormal findings: Secondary | ICD-10-CM

## 2023-03-06 ENCOUNTER — Other Ambulatory Visit: Payer: 59

## 2023-03-27 ENCOUNTER — Other Ambulatory Visit: Payer: Self-pay | Admitting: Podiatry

## 2023-03-27 DIAGNOSIS — M79671 Pain in right foot: Secondary | ICD-10-CM

## 2023-03-27 DIAGNOSIS — M19071 Primary osteoarthritis, right ankle and foot: Secondary | ICD-10-CM

## 2023-04-03 ENCOUNTER — Encounter: Payer: Self-pay | Admitting: Podiatry

## 2023-04-07 ENCOUNTER — Other Ambulatory Visit: Payer: 59

## 2023-04-17 ENCOUNTER — Other Ambulatory Visit: Payer: 59

## 2023-05-05 ENCOUNTER — Ambulatory Visit
Admission: RE | Admit: 2023-05-05 | Discharge: 2023-05-05 | Disposition: A | Discharge status: Home / Self Care | Source: Ambulatory Visit | Attending: Podiatry | Admitting: Podiatry

## 2023-05-05 DIAGNOSIS — M19071 Primary osteoarthritis, right ankle and foot: Secondary | ICD-10-CM

## 2023-05-05 DIAGNOSIS — M79671 Pain in right foot: Secondary | ICD-10-CM
